# Patient Record
Sex: Female | Born: 1985 | Race: Black or African American | Hispanic: No | Marital: Married | Smoking: Never smoker
Health system: Southern US, Community
[De-identification: ages and names within clinical notes are randomized; demographics above are authoritative.]

## PROBLEM LIST (undated history)

## (undated) DIAGNOSIS — I10 Essential (primary) hypertension: Secondary | ICD-10-CM

## (undated) DIAGNOSIS — T7840XA Allergy, unspecified, initial encounter: Secondary | ICD-10-CM

## (undated) DIAGNOSIS — F419 Anxiety disorder, unspecified: Secondary | ICD-10-CM

## (undated) HISTORY — DX: Anxiety disorder, unspecified: F41.9

## (undated) HISTORY — DX: Allergy, unspecified, initial encounter: T78.40XA

## (undated) HISTORY — DX: Essential (primary) hypertension: I10

---

## 2011-11-27 DIAGNOSIS — Z87898 Personal history of other specified conditions: Secondary | ICD-10-CM | POA: Insufficient documentation

## 2011-11-27 DIAGNOSIS — J310 Chronic rhinitis: Secondary | ICD-10-CM | POA: Insufficient documentation

## 2012-05-06 DIAGNOSIS — Z3400 Encounter for supervision of normal first pregnancy, unspecified trimester: Secondary | ICD-10-CM | POA: Insufficient documentation

## 2013-01-27 ENCOUNTER — Encounter (HOSPITAL_COMMUNITY): Payer: Self-pay | Admitting: Emergency Medicine

## 2013-01-27 ENCOUNTER — Emergency Department (HOSPITAL_COMMUNITY)
Admission: EM | Admit: 2013-01-27 | Discharge: 2013-01-27 | Disposition: A | Discharge status: Home / Self Care | Payer: Medicaid Other | Source: Home / Self Care

## 2013-01-27 DIAGNOSIS — B372 Candidiasis of skin and nail: Secondary | ICD-10-CM

## 2013-01-27 MED ORDER — MUPIROCIN CALCIUM 2 % EX CREA: TOPICAL_CREAM | Freq: Three times a day (TID) | CUTANEOUS | Status: DC

## 2013-01-27 NOTE — ED Provider Notes (Signed)
History     CSN: [id number]  Arrival date & time 01/27/13  1221   None     Chief Complaint  Patient presents with  . Insect Bite    (Consider location/radiation/quality/duration/timing/severity/associated sxs/prior treatment) HPI Comments: And patient is complaining of itching and drainage from the mellitus. There is mild irritation and associated with scant bleeding and. She denies trauma. Denies abdominal pain or GI symptoms.   History reviewed. No pertinent past medical history.  History reviewed. No pertinent past surgical history.  No family history on file.  History  Substance Use Topics  . Smoking status: Never Smoker   . Smokeless tobacco: Not on file  . Alcohol Use: No    OB History   Grav Para Term Preterm Abortions TAB SAB Ect Mult Living                  Review of Systems  All other systems reviewed and are negative.    Allergies  Review of patient's allergies indicates no known allergies.  Home Medications   Current Outpatient Rx  Name  Route  Sig  Dispense  Refill  . Fexofenadine HCl (ALLEGRA PO)   Oral   Take by mouth.         . norethindrone-ethinyl estradiol (JUNEL FE,GILDESS FE,LOESTRIN FE) 1-20 MG-MCG tablet   Oral   Take 1 tablet by mouth daily.         . mupirocin cream (BACTROBAN) 2 %   Topical   Apply topically 3 (three) times daily. To affected area   15 g   0     BP 124/86  Pulse 99  Temp(Src) 98.3 F (36.8 C) (Oral)  Resp 16  SpO2 100%  LMP 01/13/2013  Physical Exam  Nursing note and vitals reviewed. Constitutional: She appears well-developed and well-nourished. No distress.  Abdominal: Soft. Bowel sounds are normal. She exhibits no distension. There is no tenderness. There is no rebound.    ED Course  Procedures (including critical care time)  Labs Reviewed - No data to display No results found.   1. Candida infection of flexural skin       MDM  Apply mupirocin 3 times a day for 5-6 days. Apply  the Lamisil OTC cream or Lotrimin AF to 3 times a day for 2 weeks. Keep the umbilicus dry.        Hayden Rasmussen, NP 01/27/13 [phone]

## 2013-01-27 NOTE — ED Provider Notes (Signed)
Medical screening examination/treatment/procedure(s) were performed by non-physician practitioner and as supervising physician I was immediately available for consultation/collaboration.  Leslee Home, M.D.   Reuben Likes, MD 01/27/13 [phone]

## 2013-01-27 NOTE — ED Notes (Signed)
Pt is here b/c she noticed her belly button yest draining blood and puss... Reports she went to a ladies retreat last week and was exposed to insects... sxs include: itchiness and swelling... Swelling has gone down... Denies: fevers, pain... She is alert and oriented w/no signs of acute distress.

## 2013-07-01 ENCOUNTER — Ambulatory Visit: Payer: Self-pay | Admitting: Advanced Practice Midwife

## 2016-01-08 DIAGNOSIS — H52223 Regular astigmatism, bilateral: Secondary | ICD-10-CM | POA: Diagnosis not present

## 2016-10-16 ENCOUNTER — Ambulatory Visit (INDEPENDENT_AMBULATORY_CARE_PROVIDER_SITE_OTHER): Payer: BLUE CROSS/BLUE SHIELD | Admitting: Family Medicine

## 2016-10-16 ENCOUNTER — Encounter: Payer: Self-pay | Admitting: Family Medicine

## 2016-10-16 VITALS — BP 110/70 | HR 91 | Resp 12 | Ht 66.0 in | Wt 149.1 lb

## 2016-10-16 DIAGNOSIS — Z131 Encounter for screening for diabetes mellitus: Secondary | ICD-10-CM

## 2016-10-16 DIAGNOSIS — J309 Allergic rhinitis, unspecified: Secondary | ICD-10-CM | POA: Diagnosis not present

## 2016-10-16 DIAGNOSIS — Z0001 Encounter for general adult medical examination with abnormal findings: Secondary | ICD-10-CM

## 2016-10-16 DIAGNOSIS — Z30011 Encounter for initial prescription of contraceptive pills: Secondary | ICD-10-CM | POA: Diagnosis not present

## 2016-10-16 LAB — POCT GLYCOSYLATED HEMOGLOBIN (HGB A1C): HEMOGLOBIN A1C: 5.1

## 2016-10-16 MED ORDER — MONTELUKAST SODIUM 10 MG PO TABS: 10.0000 mg | ORAL_TABLET | Freq: Every day | ORAL | 3 refills | Status: DC

## 2016-10-16 MED ORDER — NORETHIN ACE-ETH ESTRAD-FE 1-20 MG-MCG PO TABS: 1.0000 | ORAL_TABLET | Freq: Every day | ORAL | 1 refills | Status: DC

## 2016-10-16 NOTE — Patient Instructions (Signed)
A few things to remember from today's visit:   Encounter for general adult medical examination with abnormal findings  Diabetes mellitus screening - Plan: POCT HgB A1C  Allergic rhinitis, unspecified chronicity, unspecified seasonality, unspecified trigger - Plan: montelukast (SINGULAIR) 10 MG tablet  Encounter for initial prescription of contraceptive pills - Plan: norethindrone-ethinyl estradiol (JUNEL FE,GILDESS FE,LOESTRIN FE) 1-20 MG-MCG tablet    At least 150 minutes of moderate exercise per week, daily brisk walking for 15-30 min is a good exercise option. Healthy diet low in saturated (animal) fats and sweets and consisting of fresh fruits and vegetables, lean meats such as fish and white chicken and whole grains.   - Vaccines:  Tdap vaccine every 10 years.  Shingles vaccine recommended at age 31, could be given after 31 years of age but not sure about insurance coverage.  Pneumonia vaccines:  Prevnar 13 at 65 and Pneumovax at 66.  Screening recommendations for low/normal risk women:  Screening for diabetes at age 31-45 and every 3 years.  Cervical cancer prevention:  -HPV vaccination between 839-31 years old. -Pap smear starts at 31 years of age and continues periodically until 31 years old in low risk women. Pap smear every 3 years between 3121 and 31 years old. Pap smear every 3 years between women 30 and older if pap smear negative and HPV screening negative. OVERDUE,please establish with gyn as discussed.   -Breast cancer: Mammogram: There is disagreement between experts about when to start screening in low risk asymptomatic female but recent recommendations are to start screening at 5040 and not later than 31 years old , every 1-2 years and after 31 yo q 2 years. Screening is recommended until 31 years old but some women can continue screening depending of healthy issues.   Colon cancer screening: starts at 31 years old until 31 years old.  Cholesterol disorder  screening at age 31 and every 3 years.  FOLIC ACID 1 MG DAILY.  A1C: negative for diabetes.  Please be sure medication list is accurate. If a new problem present, please set up appointment sooner than planned today.

## 2016-10-16 NOTE — Progress Notes (Signed)
HPI:   [name] is a 31 y.o. female, who is here today with her husband to establish care and for her routine physical.  Regular exercise 3 or more time per week: No Following a healthy diet: Yes. She lives with husband and their 103 yo daughter.  Chronic medical problems: Allergic rhinitis.She has not started OTC intranasal steroid or antihistaminic,symptoms started today.She wonders if she needs to see immunologists.  She states that her allergies are usually "bad",denies Hx of asthma.   Pap smear 12/2014. She is planning on establishing with gyn in the next few weeks. Hx of abnormal pap smears: Denies. Tdap: She thinks last one was 4 years ago. Hx of STD's 2013, Chlamydia.  -She needs refills on her OCP's, which she has taken for 4 years, ran out a week ago. LMP 10/05/16. She is planning on another child in the next year or so, she is concerned about being on OCP's for "so long", afraid of increasing her risk of cancer.   Review of Systems  Constitutional: Negative for appetite change, fatigue, fever and unexpected weight change.  HENT: Positive for congestion, postnasal drip and rhinorrhea. Negative for dental problem, hearing loss, nosebleeds, sinus pain, sore throat, trouble swallowing and voice change.   Eyes: Negative for pain, redness and visual disturbance.  Respiratory: Negative for cough, shortness of breath and wheezing.   Cardiovascular: Negative for chest pain and leg swelling.  Gastrointestinal: Negative for abdominal pain, blood in stool, nausea and vomiting.       No changes in bowel habits.  Endocrine: Negative for cold intolerance, heat intolerance, polydipsia, polyphagia and polyuria.  Genitourinary: Negative for decreased urine volume, dysuria, hematuria, pelvic pain, vaginal bleeding and vaginal discharge.       No breast tenderness or nipple discharge.  Musculoskeletal: Negative for gait problem and myalgias.  Skin: Negative for rash.    Allergic/Immunologic: Positive for environmental allergies.  Neurological: Negative for syncope, weakness and headaches.  Hematological: Negative for adenopathy. Does not bruise/bleed easily.  Psychiatric/Behavioral: Negative for confusion and sleep disturbance. The patient is nervous/anxious.   All other systems reviewed and are negative.   No current outpatient prescriptions on file prior to visit.   No current facility-administered medications on file prior to visit.      Past Medical History:  Diagnosis Date  . Allergy     No Known Allergies  Family History  Problem Relation Age of Onset  . Stroke Mother     TIA  . Hypertension Mother   . Diabetes Father   . Hypertension Father   . Cancer Maternal Grandmother     ovarian  . Hypertension Maternal Grandmother   . Hypertension Paternal Grandmother   . Hypertension Paternal Uncle   . Diabetes Maternal Grandfather   . Hypertension Maternal Grandfather   . Hypertension Paternal Grandfather     Social History   Social History  . Marital status: Single    Spouse name: N/A  . Number of children: N/A  . Years of education: N/A   Social History Main Topics  . Smoking status: Never Smoker  . Smokeless tobacco: Never Used  . Alcohol use Yes     Comment: occasional  . Drug use: No  . Sexual activity: Yes    Birth control/ protection: Pill   Other Topics Concern  . None   Social History Narrative  . None     Vitals:   10/16/16 1150  BP: 110/70  Pulse:  91  Resp: 12   Body mass index is 24.07 kg/m.  O2 sat 99% at RA.  Wt Readings from Last 3 Encounters:  10/16/16 149 lb 2 oz (67.6 kg)    Physical Exam  Nursing note and vitals reviewed. Constitutional: She is oriented to person, place, and time. She appears well-developed and well-nourished. No distress.  HENT:  Head: Atraumatic.  Right Ear: Hearing and external ear normal.  Left Ear: Hearing and external ear normal.  Nose: Rhinorrhea present.  Right sinus exhibits no maxillary sinus tenderness and no frontal sinus tenderness. Left sinus exhibits no maxillary sinus tenderness and no frontal sinus tenderness.  Mouth/Throat: Uvula is midline, oropharynx is clear and moist and mucous membranes are normal.  Excess cerumen bilateral   Eyes: Conjunctivae and EOM are normal. Pupils are equal, round, and reactive to light.  Neck: Normal range of motion. No thyroid mass and no thyromegaly present.  Cardiovascular: Normal rate and regular rhythm.   No murmur heard. Pulses:      Dorsalis pedis pulses are 2+ on the right side, and 2+ on the left side.  Respiratory: Effort normal and breath sounds normal. No respiratory distress.  GI: Soft. She exhibits no mass. There is no hepatomegaly. There is no tenderness.  Genitourinary: No breast tenderness.  Genitourinary Comments: Breast: Fibrocystic changes outer upper quadrant bilateral. No masses and no nipple discharge. Pelvic exam and pap smear deferred to gyn visit.  Musculoskeletal: She exhibits no edema or tenderness.  No major deformity or signs of synovitis appreciated.  Lymphadenopathy:    She has no cervical adenopathy.    She has no axillary adenopathy.       Right: No supraclavicular adenopathy present.       Left: No supraclavicular adenopathy present.  Neurological: She is alert and oriented to person, place, and time. She has normal strength. No cranial nerve deficit. Coordination and gait normal.  Reflex Scores:      Bicep reflexes are 2+ on the right side and 2+ on the left side.      Patellar reflexes are 2+ on the right side and 2+ on the left side. Skin: Skin is warm. No rash noted. No erythema.  Psychiatric: She has a normal mood and affect. Her speech is normal.  Well groomed, good eye contact.    ASSESSMENT AND PLAN:   [name] was seen today for annual exam.  Diagnoses and all orders for this visit:  Encounter for general adult medical examination with abnormal  findings  We discussed the importance of regular physical activity and healthy diet for prevention of chronic illness and/or complications. Preventive guidelines reviewed. Vaccination up to date per pt report. Daily Folic acid 1 mg recommended. She will establish with gyn for her Pap smear.  Next CPE in 1-3 years.  Diabetes mellitus screening -     POCT HgB A1C: 5.1  Allergic rhinitis, unspecified chronicity, unspecified seasonality, unspecified trigger  Symptomatic. Zyrtec 10 mg OTC,continue Flonase nasal spray. Singulair 10 mg added. She was instructed to let me know in 4-6 weeks if medications have helped or if she still would like immunology referral.   -     montelukast (SINGULAIR) 10 MG tablet; Take 1 tablet (10 mg total) by mouth at bedtime.  Encounter for initial prescription of contraceptive pills  Resume OCP's with her next menstrual period. Condoms use for now. Some side effects discussed as well as current evidence about gyn cancer and OCP's based on observational studies.  -  norethindrone-ethinyl estradiol (JUNEL FE,GILDESS FE,LOESTRIN FE) 1-20 MG-MCG tablet; Take 1 tablet by mouth daily.     Return in 1 year (on 10/16/2017).    Porfirio Bollier G. [place], MD  [facility]. [place] office.

## 2016-10-16 NOTE — Progress Notes (Signed)
Pre visit review using our clinic review tool, if applicable. No additional management support is needed unless otherwise documented below in the visit note. 

## 2016-11-06 DIAGNOSIS — H16101 Unspecified superficial keratitis, right eye: Secondary | ICD-10-CM | POA: Diagnosis not present

## 2016-12-01 ENCOUNTER — Telehealth: Payer: Self-pay | Admitting: Family Medicine

## 2016-12-01 NOTE — Telephone Encounter (Signed)
Pt would like to know if she can transfer from [place] to [place] .  Mother in law see Plot

## 2016-12-02 NOTE — Telephone Encounter (Signed)
OK w/me Thx 

## 2016-12-04 NOTE — Telephone Encounter (Signed)
It is Ok with me. Thanks

## 2017-04-03 ENCOUNTER — Other Ambulatory Visit: Payer: Self-pay | Admitting: Family Medicine

## 2017-04-03 DIAGNOSIS — Z30011 Encounter for initial prescription of contraceptive pills: Secondary | ICD-10-CM

## 2017-05-29 DIAGNOSIS — F4323 Adjustment disorder with mixed anxiety and depressed mood: Secondary | ICD-10-CM | POA: Diagnosis not present

## 2017-06-15 DIAGNOSIS — F4323 Adjustment disorder with mixed anxiety and depressed mood: Secondary | ICD-10-CM | POA: Diagnosis not present

## 2017-07-17 DIAGNOSIS — F4323 Adjustment disorder with mixed anxiety and depressed mood: Secondary | ICD-10-CM | POA: Diagnosis not present

## 2017-08-16 DIAGNOSIS — J020 Streptococcal pharyngitis: Secondary | ICD-10-CM | POA: Diagnosis not present

## 2017-08-16 DIAGNOSIS — J029 Acute pharyngitis, unspecified: Secondary | ICD-10-CM | POA: Diagnosis not present

## 2017-08-27 DIAGNOSIS — J069 Acute upper respiratory infection, unspecified: Secondary | ICD-10-CM | POA: Diagnosis not present

## 2017-11-06 DIAGNOSIS — F4323 Adjustment disorder with mixed anxiety and depressed mood: Secondary | ICD-10-CM | POA: Diagnosis not present

## 2017-11-15 DIAGNOSIS — F4323 Adjustment disorder with mixed anxiety and depressed mood: Secondary | ICD-10-CM | POA: Diagnosis not present

## 2017-12-11 DIAGNOSIS — F4323 Adjustment disorder with mixed anxiety and depressed mood: Secondary | ICD-10-CM | POA: Diagnosis not present

## 2018-01-10 ENCOUNTER — Ambulatory Visit (INDEPENDENT_AMBULATORY_CARE_PROVIDER_SITE_OTHER): Payer: BLUE CROSS/BLUE SHIELD | Admitting: Family Medicine

## 2018-01-10 ENCOUNTER — Encounter: Payer: Self-pay | Admitting: Family Medicine

## 2018-01-10 VITALS — BP 120/84 | HR 87 | Temp 98.3°F | Resp 12 | Ht 66.25 in | Wt 150.6 lb

## 2018-01-10 DIAGNOSIS — Z0000 Encounter for general adult medical examination without abnormal findings: Secondary | ICD-10-CM | POA: Diagnosis not present

## 2018-01-10 NOTE — Patient Instructions (Addendum)
A few things to remember from today's visit:   Routine general medical examination at a health care facility  Today you have you routine preventive visit.  At least 150 minutes of moderate exercise per week, daily brisk walking for 15-30 min is a good exercise option. Healthy diet low in saturated (animal) fats and sweets and consisting of fresh fruits and vegetables, lean meats such as fish and white chicken and whole grains.  These are some of recommendations for screening depending of age and risk factors:  Folic acid 1 mg daily.   - Vaccines:  Tdap vaccine every 10 years.  Shingles vaccine recommended at age 24, could be given after 32 years of age but not sure about insurance coverage.   Pneumonia vaccines:  Prevnar 13 at 65 and Pneumovax at 66. Sometimes Pneumovax is giving earlier if history of smoking, lung disease,diabetes,kidney disease among some.    Screening for diabetes at age 84 and every 3 years.  Cervical cancer prevention:  Pap smear starts at 31 years of age and continues periodically until 32 years old in low risk women. Pap smear every 3 years between 2 and 5 years old. Pap smear every 3-5 years between women 30 and older if pap smear negative and HPV screening negative.   -Breast cancer: Mammogram: There is disagreement between experts about when to start screening in low risk asymptomatic female but recent recommendations are to start screening at 67 and not later than 32 years old , every 1-2 years and after 32 yo q 2 years. Screening is recommended until 32 years old but some women can continue screening depending of healthy issues.   Colon cancer screening: starts at 32 years old until 32 years old.  Cholesterol disorder screening at age 46 and every 3 years.  Also recommended:  1. Dental visit- Brush and floss your teeth twice daily; visit your dentist twice a year. 2. Eye doctor- Get an eye exam at least every 2 years. 3. Helmet use- Always  wear a helmet when riding a bicycle, motorcycle, rollerblading or skateboarding. 4. Safe sex- If you may be exposed to sexually transmitted infections, use a condom. 5. Seat belts- Seat belts can save your live; always wear one. 6. Smoke/Carbon Monoxide detectors- These detectors need to be installed on the appropriate level of your home. Replace batteries at least once a year. 7. Skin cancer- When out in the sun please cover up and use sunscreen 15 SPF or higher. 8. Violence- If anyone is threatening or hurting you, please tell your healthcare provider.  9. Drink alcohol in moderation- Limit alcohol intake to one drink or less per day. Never drink and drive.   Please be sure medication list is accurate. If a new problem present, please set up appointment sooner than planned today.

## 2018-01-10 NOTE — Progress Notes (Signed)
HPI:   [name] is a 32 y.o. female, who is here today for her routine physical.  Last CPE: 10/2016  Regular exercise 3 or more time per week: Not consistently. Following a healthy diet: Yes. She lives with her husband and daughter.  Chronic medical problems: Allergic rhinitis.  She is continue Singulair, she prefers to take something "natural."   Pap smear, 2015.  Last visit she was planning on establishing with gynecologist, she has not done so. She is still taking OCPs.  Hx of abnormal pap smears: Over 10 years ago. Hx of STD's: Chlamydia in 2013.   There is no immunization history on file for this patient.   She has no concerns today.     Review of Systems  Constitutional: Negative for appetite change, fatigue and fever.  HENT: Positive for congestion, postnasal drip and rhinorrhea. Negative for hearing loss, mouth sores, trouble swallowing and voice change.   Eyes: Negative for photophobia and visual disturbance.  Respiratory: Negative for cough, shortness of breath and wheezing.   Cardiovascular: Negative for chest pain and leg swelling.  Gastrointestinal: Negative for abdominal pain, nausea and vomiting.       No changes in bowel habits.  Endocrine: Negative for cold intolerance, heat intolerance, polydipsia, polyphagia and polyuria.  Genitourinary: Negative for decreased urine volume, dysuria, hematuria, vaginal bleeding and vaginal discharge.  Musculoskeletal: Negative for arthralgias, back pain and neck pain.  Skin: Negative for color change and rash.  Allergic/Immunologic: Positive for environmental allergies.  Neurological: Negative for seizures, syncope, weakness, numbness and headaches.  Hematological: Negative for adenopathy. Does not bruise/bleed easily.  Psychiatric/Behavioral: Negative for confusion and sleep disturbance. The patient is not nervous/anxious.   All other systems reviewed and are negative.     Current Outpatient  Medications on File Prior to Visit  Medication Sig Dispense Refill  . JUNEL FE 1/20 1-20 MG-MCG tablet TAKE 1 TABLET BY MOUTH ONCE DAILY 84 tablet 3   No current facility-administered medications on file prior to visit.      Past Medical History:  Diagnosis Date  . Allergy     History reviewed. No pertinent surgical history.  No Known Allergies  Family History  Problem Relation Age of Onset  . Stroke Mother        TIA  . Hypertension Mother   . Diabetes Father   . Hypertension Father   . Cancer Maternal Grandmother        ovarian  . Hypertension Maternal Grandmother   . Hypertension Paternal Grandmother   . Hypertension Paternal Uncle   . Diabetes Maternal Grandfather   . Hypertension Maternal Grandfather   . Hypertension Paternal Grandfather     Social History   Socioeconomic History  . Marital status: Married    Spouse name: Not on file  . Number of children: Not on file  . Years of education: Not on file  . Highest education level: Not on file  Occupational History  . Not on file  Social Needs  . Financial resource strain: Not on file  . Food insecurity:    Worry: Not on file    Inability: Not on file  . Transportation needs:    Medical: Not on file    Non-medical: Not on file  Tobacco Use  . Smoking status: Never Smoker  . Smokeless tobacco: Never Used  Substance and Sexual Activity  . Alcohol use: Yes    Comment: occasional  . Drug use: No  .  Sexual activity: Yes    Birth control/protection: Pill  Lifestyle  . Physical activity:    Days per week: Not on file    Minutes per session: Not on file  . Stress: Not on file  Relationships  . Social connections:    Talks on phone: Not on file    Gets together: Not on file    Attends religious service: Not on file    Active member of club or organization: Not on file    Attends meetings of clubs or organizations: Not on file    Relationship status: Not on file  Other Topics Concern  . Not on file    Social History Narrative  . Not on file     Vitals:   01/10/18 0913  BP: 120/84  Pulse: 87  Resp: 12  Temp: 98.3 F (36.8 C)  SpO2: 93%   Body mass index is 24.12 kg/m.   Wt Readings from Last 3 Encounters:  01/10/18 150 lb 9.6 oz (68.3 kg)  10/16/16 149 lb 2 oz (67.6 kg)    Physical Exam  Nursing note and vitals reviewed. Constitutional: She is oriented to person, place, and time. She appears well-developed and well-nourished. No distress.  HENT:  Head: Normocephalic and atraumatic.  Right Ear: Hearing, tympanic membrane, external ear and ear canal normal.  Left Ear: Hearing, tympanic membrane, external ear and ear canal normal.  Mouth/Throat: Uvula is midline, oropharynx is clear and moist and mucous membranes are normal.  Eyes: Pupils are equal, round, and reactive to light. Conjunctivae and EOM are normal.  Neck: No tracheal deviation present. No thyromegaly present.  Cardiovascular: Normal rate and regular rhythm.  No murmur heard. Pulses:      Dorsalis pedis pulses are 2+ on the right side, and 2+ on the left side.  Respiratory: Effort normal and breath sounds normal. No respiratory distress.  GI: Soft. She exhibits no mass. There is no hepatomegaly. There is no tenderness.  Genitourinary:  Genitourinary Comments: Breast: Fibrocystic changes L>R,outer upper quadrant. No masses,skin abnormalities,or nipple discharge.  Musculoskeletal: She exhibits no edema.  No major deformity or signs of synovitis appreciated.  Lymphadenopathy:    She has no cervical adenopathy.       Right: No supraclavicular adenopathy present.       Left: No supraclavicular adenopathy present.  Neurological: She is alert and oriented to person, place, and time. She has normal strength. No cranial nerve deficit. Coordination and gait normal.  Reflex Scores:      Bicep reflexes are 2+ on the right side and 2+ on the left side.      Patellar reflexes are 2+ on the right side and 2+ on the  left side. Skin: Skin is warm. No rash noted. No erythema.  Psychiatric: She has a normal mood and affect. Cognition and memory are normal.  Well groomed, good eye contact.      ASSESSMENT AND PLAN:  Ms. Tammie Hernandez was here today annual physical examination.   Routine general medical examination at a health care facility  We discussed the importance of regular physical activity and healthy diet for prevention of chronic illness and/or complications. Preventive guidelines reviewed. Vaccination up to date per pt report.She is not sure about date of her last Tdap but sure it has not been 10 years. She did not want pap smear today,she is overdue. Planning on pregnancy ,so she would like to wait until she establishes with gyn.Ensures me she will arrange appt with  gyn. Daily folic acid 1 mg. Next CPE in 1-2 year.     Return in 1 year (on 01/11/2019).        Keny Donald G. [place], MD  [facility]. [place] office.

## 2018-02-08 ENCOUNTER — Ambulatory Visit: Payer: Self-pay | Admitting: *Deleted

## 2018-02-08 ENCOUNTER — Ambulatory Visit (INDEPENDENT_AMBULATORY_CARE_PROVIDER_SITE_OTHER): Payer: PRIVATE HEALTH INSURANCE | Admitting: Family Medicine

## 2018-02-08 ENCOUNTER — Encounter: Payer: Self-pay | Admitting: Family Medicine

## 2018-02-08 VITALS — BP 118/72 | HR 72 | Temp 98.5°F | Resp 12 | Ht 66.25 in | Wt 147.0 lb

## 2018-02-08 DIAGNOSIS — R1013 Epigastric pain: Secondary | ICD-10-CM | POA: Diagnosis not present

## 2018-02-08 DIAGNOSIS — M545 Low back pain, unspecified: Secondary | ICD-10-CM

## 2018-02-08 LAB — POCT URINALYSIS DIPSTICK
Bilirubin, UA: NEGATIVE
Glucose, UA: NEGATIVE
KETONES UA: NEGATIVE
Leukocytes, UA: NEGATIVE
Nitrite, UA: NEGATIVE
PH UA: 6.5 (ref 5.0–8.0)
Protein, UA: POSITIVE — AB
RBC UA: POSITIVE
Spec Grav, UA: 1.025 (ref 1.010–1.025)
UROBILINOGEN UA: 0.2 U/dL

## 2018-02-08 MED ORDER — MELOXICAM 7.5 MG PO TABS: 7.5000 mg | ORAL_TABLET | Freq: Two times a day (BID) | ORAL | 0 refills | Status: AC | PRN

## 2018-02-08 MED ORDER — OMEPRAZOLE 40 MG PO CPDR: 40.0000 mg | DELAYED_RELEASE_CAPSULE | Freq: Every day | ORAL | 0 refills | Status: DC

## 2018-02-08 NOTE — Telephone Encounter (Signed)
Pt called with pain at her lower back down to her tailbone. She states the pain sometimes travel down towards her left thigh. Always worst after sitting all day. Has used heat and taken ibuprofen but without much relief.  Also she is having some upper abd discomfort, possibly IBS. Had bm this morning that was normal. No nausea or vomiting or cramping. [facility].  Pt advised that the office will give her a call back regarding an appointment. Pt voiced understanding. Home care given with verbal understanding.  Reason for Disposition . [1] Pain radiates into the thigh or further down the leg AND [2] both legs  Answer Assessment - Initial Assessment Questions 1. ONSET: "When did the pain begin?"      First weekend in June 2. LOCATION: "Where does it hurt?" (upper, mid or lower back)     Lower part of back close to tailbone 3. SEVERITY: "How bad is the pain?"  (e.g., Scale 1-10; mild, moderate, or severe)   - MILD (1-3): doesn't interfere with normal activities    - MODERATE (4-7): interferes with normal activities or awakens from sleep    - SEVERE (8-10): excruciating pain, unable to do any normal activities      Pain # 7 or 8 near the end of the day 4. PATTERN: "Is the pain constant?" (e.g., yes, no; constant, intermittent)      constant 5. RADIATION: "Does the pain shoot into your legs or elsewhere?"     Towards the back of left leg to the thigh 6. CAUSE:  "What do you think is causing the back pain?"      No sure 7. BACK OVERUSE:  "Any recent lifting of heavy objects, strenuous work or exercise?"     no 8. MEDICATIONS: "What have you taken so far for the pain?" (e.g., nothing, acetaminophen, NSAIDS)     Ibuprofen, heating pad 9. NEUROLOGIC SYMPTOMS: "Do you have any weakness, numbness, or problems with bowel/bladder control?"     no 10. OTHER SYMPTOMS: "Do you have any other symptoms?" (e.g., fever, abdominal pain, burning with  urination, blood in urine)       abd discomfort 11. PREGNANCY: "Is there any chance you are pregnant?" (e.g., yes, no; LMP)       No. LMP this week  Answer Assessment - Initial Assessment Questions 1. LOCATION: "Where does it hurt?"      Upper abd 2. RADIATION: "Does the pain shoot anywhere else?" (e.g., chest, back)     no 3. ONSET: "When did the pain begin?" (e.g., minutes, hours or days ago)      Over last weekend 4. SUDDEN: "Gradual or sudden onset?"     gradual 5. PATTERN "Does the pain come and go, or is it constant?"    - If constant: "Is it getting better, staying the same, or worsening?"      (Note: Constant means the pain never goes away completely; most serious pain is constant and it progresses)     - If intermittent: "How long does it last?" "Do you have pain now?"     (Note: Intermittent means the pain goes away completely between bouts)     constant 6. SEVERITY: "How bad is the pain?"  (e.g., Scale 1-10; mild, moderate, or severe)   - MILD (1-3): doesn't interfere with normal activities, abdomen soft and not tender to touch    - MODERATE (4-7): interferes with normal activities or awakens from  sleep, tender to touch    - SEVERE (8-10): excruciating pain, doubled over, unable to do any normal activities      discomfort 7. RECURRENT SYMPTOM: "Have you ever had this type of abdominal pain before?" If so, ask: "When was the last time?" and "What happened that time?"      no 8. CAUSE: "What do you think is causing the abdominal pain?"     Not sure, maybe IBS 9. RELIEVING/AGGRAVATING FACTORS: "What makes it better or worse?" (e.g., movement, antacids, bowel movement)     n/a 10. OTHER SYMPTOMS: "Has there been any vomiting, diarrhea, constipation, or urine problems?"       Earlier this week, had diarrhea  Protocols used: BACK PAIN-A-AH, ABDOMINAL PAIN - [facility]

## 2018-02-08 NOTE — Telephone Encounter (Signed)
Please ask her if she can come today around  2:30 pm, she may need to wait. Otherwise [facility].  Thanks, BJ

## 2018-02-08 NOTE — Progress Notes (Signed)
ACUTE VISIT   HPI:  Chief Complaint  Patient presents with  . Back Pain    started 1 month ago, flared up really bad over the last few days    Tammie Hernandez is a 32 y.o. female, who is here today complaining of a days of lower back pain, no radiated. She has had this pain intermittently for a while, aggravated sometimes by heavy lifting.   Sore leg pain, 8/10 max, it has been stable.  It is exacerbated by standing up on with movement. It is alleviated when she sits in certain way. She has not noted local edema, erythema, or rash. She denies lower extremity numbness or tingling, saddle anesthesia, or urine/bowel incontinence. In the past she has taking Motrin and have used heating pad, she has not tried any treatment recently.  No associated urinary symptoms.  She states that she does not want to take anti-inflammatory medication without knowing exactly what is causing the pain. She denies any history of trauma. No associated arthralgia or joint erythema. Denies dysuria,increased urinary frequency, gross hematuria,or decreased urine output.   Abdominal pain: She is also complaining about a week of epigastric discomfort, bloating sensation.  She has not noted heartburn or burping. She has not identified exacerbating or alleviating factors. Associated nausea. No fever, chills, changes in bowel habits, melena, vomiting, or abnormal weight loss.  She has not try OTC medication.  Last bowel movement yesterday. LMP 02/04/18.   Review of Systems  Constitutional: Negative for activity change, appetite change, fatigue, fever and unexpected weight change.  HENT: Negative for mouth sores, sore throat and trouble swallowing.   Respiratory: Negative for cough, shortness of breath and wheezing.   Cardiovascular: Negative for leg swelling.  Gastrointestinal: Positive for abdominal pain and nausea. Negative for blood in stool and vomiting.       Negative for changes  in bowel habits.  Genitourinary: Negative for decreased urine volume, dysuria, hematuria, vaginal bleeding and vaginal discharge.  Musculoskeletal: Positive for back pain. Negative for arthralgias, joint swelling and neck pain.  Skin: Negative for rash and wound.  Allergic/Immunologic: Positive for environmental allergies.  Neurological: Negative for weakness, numbness and headaches.  Hematological: Negative for adenopathy. Does not bruise/bleed easily.  Psychiatric/Behavioral: Negative for confusion. The patient is nervous/anxious.       Current Outpatient Medications on File Prior to Visit  Medication Sig Dispense Refill  . JUNEL FE 1/20 1-20 MG-MCG tablet TAKE 1 TABLET BY MOUTH ONCE DAILY 84 tablet 3   No current facility-administered medications on file prior to visit.      Past Medical History:  Diagnosis Date  . Allergy    No Known Allergies  Social History   Socioeconomic History  . Marital status: Married    Spouse name: Not on file  . Number of children: Not on file  . Years of education: Not on file  . Highest education level: Not on file  Occupational History  . Not on file  Social Needs  . Financial resource strain: Not on file  . Food insecurity:    Worry: Not on file    Inability: Not on file  . Transportation needs:    Medical: Not on file    Non-medical: Not on file  Tobacco Use  . Smoking status: Never Smoker  . Smokeless tobacco: Never Used  Substance and Sexual Activity  . Alcohol use: Yes    Comment: occasional  . Drug use: No  . Sexual  activity: Yes    Birth control/protection: Pill  Lifestyle  . Physical activity:    Days per week: Not on file    Minutes per session: Not on file  . Stress: Not on file  Relationships  . Social connections:    Talks on phone: Not on file    Gets together: Not on file    Attends religious service: Not on file    Active member of club or organization: Not on file    Attends meetings of clubs or  organizations: Not on file    Relationship status: Not on file  Other Topics Concern  . Not on file  Social History Narrative  . Not on file    Vitals:   02/08/18 1531  BP: 118/72  Pulse: 72  Resp: 12  Temp: 98.5 F (36.9 C)  SpO2: 99%   Body mass index is 23.55 kg/m.   Physical Exam  Nursing note and vitals reviewed. Constitutional: She is oriented to person, place, and time. She appears well-developed and well-nourished. She does not appear ill. No distress.  HENT:  Head: Normocephalic and atraumatic.  Eyes: Conjunctivae are normal.  Cardiovascular: Normal rate and regular rhythm.  Respiratory: Effort normal and breath sounds normal. No respiratory distress.  GI: Soft. She exhibits no mass. There is no hepatomegaly. There is tenderness in the epigastric area. There is no rebound and no guarding.  Musculoskeletal: She exhibits no edema.       Lumbar back: She exhibits decreased range of motion. She exhibits no tenderness, no bony tenderness and no edema.  No significant deformity appreciated. No tenderness upon palpation of paraspinal muscles. Pain elicited with movement on exam table during examination. No local edema or erythema appreciated, no suspicious lesions.  Antalgic gait.  Lymphadenopathy:    She has no cervical adenopathy.  Neurological: She is alert and oriented to person, place, and time. She has normal strength.  SLR negative bilateral,elicits lower back pain.  Skin: Skin is warm. No rash noted. No erythema.  Psychiatric: She has a normal mood and affect.  Well groomed, good eye contact.      ASSESSMENT AND PLAN:   Tammie Hernandez was seen today for back pain.  Diagnoses and all orders for this visit:  Bilateral low back pain without sciatica, unspecified chronicity  Most likely musculoskeletal and benign. Because she is concerned about serious process, imaging was ordered. After discussion of some side effects, she agrees with trying meloxicam  7.5 mg twice daily as needed for up to 15 days. OTC IcyHot or Aspercreme with lidocaine may also help.  -     POCT urinalysis dipstick -     DG Sacrum/Coccyx; Future -     meloxicam (MOBIC) 7.5 MG tablet; Take 1 tablet (7.5 mg total) by mouth 2 (two) times daily as needed for up to 15 days for pain.  Epigastric abdominal pain  ?  Dyspepsia. GERD precautions discussed. Recommend omeprazole 40 mg daily for 3 to 4 weeks. Instructed about warning signs. Follow-up as needed.   -     omeprazole (PRILOSEC) 40 MG capsule; Take 1 capsule (40 mg total) by mouth daily.        Return if symptoms worsen or fail to improve.     Betty G. [place], MD  [facility]. [facility].

## 2018-02-08 NOTE — Patient Instructions (Addendum)
A few things to remember from today's visit:   Bilateral low back pain without sciatica, unspecified chronicity - Plan: POCT urinalysis dipstick, DG Sacrum/Coccyx, meloxicam (MOBIC) 7.5 MG tablet  Epigastric abdominal pain  Over-the-counter IcyHot or Aspercreme with lidocaine may help. Pain seems to be musculoskeletal.  GERD:  Avoid foods that make your symptoms worse, for example coffee, chocolate,pepermeint,alcohol, and greasy food. Raising the head of your bed about 6 inches may help with nocturnal symptoms.   Instead 3 large meals daily try small and more frequent meals during the day.   You should be evaluated immediately if bloody vomiting, bloody stools, black stools (like tar), difficulty swallowing, food gets stuck on the way down or choking when eating. Abnormal weight loss or severe abdominal pain.  If symptoms are not resolved sometimes endoscopy is necessary.   Please be sure medication list is accurate. If a new problem present, please set up appointment sooner than planned today.

## 2018-02-08 NOTE — Telephone Encounter (Signed)
Patient returned call stated she would be here around 2:45 or 3 pm. Informed her that it was okay.

## 2018-02-08 NOTE — Telephone Encounter (Signed)
Left detailed message informing patient that she can come in at around 2:30 pm today or tomorrow at the [facility] from 9 to 1.

## 2018-02-08 NOTE — Telephone Encounter (Signed)
Message sent to Dr.Jordan. 

## 2018-02-09 ENCOUNTER — Other Ambulatory Visit: Payer: Self-pay | Admitting: Family Medicine

## 2018-02-09 DIAGNOSIS — Z30011 Encounter for initial prescription of contraceptive pills: Secondary | ICD-10-CM

## 2018-03-03 ENCOUNTER — Other Ambulatory Visit: Payer: Self-pay | Admitting: Family Medicine

## 2018-03-03 DIAGNOSIS — R1013 Epigastric pain: Secondary | ICD-10-CM

## 2018-04-12 ENCOUNTER — Other Ambulatory Visit: Payer: Self-pay | Admitting: Family Medicine

## 2018-04-12 DIAGNOSIS — Z30011 Encounter for initial prescription of contraceptive pills: Secondary | ICD-10-CM

## 2018-07-04 ENCOUNTER — Other Ambulatory Visit: Payer: Self-pay | Admitting: Family Medicine

## 2018-07-04 DIAGNOSIS — Z30011 Encounter for initial prescription of contraceptive pills: Secondary | ICD-10-CM

## 2018-08-21 ENCOUNTER — Encounter: Payer: Self-pay | Admitting: Family Medicine

## 2018-08-21 ENCOUNTER — Ambulatory Visit (INDEPENDENT_AMBULATORY_CARE_PROVIDER_SITE_OTHER): Payer: PRIVATE HEALTH INSURANCE | Admitting: Family Medicine

## 2018-08-21 VITALS — BP 120/68 | HR 85 | Temp 97.8°F | Resp 12 | Ht 66.25 in | Wt 149.4 lb

## 2018-08-21 DIAGNOSIS — R221 Localized swelling, mass and lump, neck: Secondary | ICD-10-CM | POA: Diagnosis not present

## 2018-08-21 DIAGNOSIS — J309 Allergic rhinitis, unspecified: Secondary | ICD-10-CM | POA: Diagnosis not present

## 2018-08-21 DIAGNOSIS — R519 Headache, unspecified: Secondary | ICD-10-CM

## 2018-08-21 DIAGNOSIS — R51 Headache: Secondary | ICD-10-CM

## 2018-08-21 MED ORDER — FLUTICASONE PROPIONATE 50 MCG/ACT NA SUSP: 1.0000 | Freq: Two times a day (BID) | NASAL | 3 refills | Status: DC

## 2018-08-21 NOTE — Patient Instructions (Signed)
A few things to remember from today's visit:   Allergic rhinitis, unspecified seasonality, unspecified trigger - Plan: fluticasone (FLONASE) 50 MCG/ACT nasal spray  Frontal headache  On examination today we could not find lump on your neck. Continue monitoring area.   Flonase intranasal steroid daily and saline nasal irrigations several times per day may help with sinus pressure. Also over-the-counter antihistaminic like Zyrtec 10 mg daily.  Please be sure medication list is accurate. If a new problem present, please set up appointment sooner than planned today.

## 2018-08-21 NOTE — Progress Notes (Signed)
ACUTE VISIT   HPI:  Chief Complaint  Patient presents with  . Lump under right ear    swollen, no pain  . Sinus issues    Ms.Renae Lenetta QuakerDenise Whisonant is a 33 y.o. female, who is here today complaining of above problems. "A while" ago she noted "lump" behind right ear lobe. She has not noted growth,skin changes,or pain. Denies fever,chills,night sweats,or abnormal wt loss. Negative for oral lesions,earache,or drainage.  States that it seems to be gone but still would like this to be evaluated.  Mild frontal pressure headache for the past few days. It is intermittent, worse in the morning. She has not identified exacerbating or alleviating factors.  Nasal congestion,rhinorrhea,and post nasal drainage. No sick contact or recent travel.  + Fatigue. Denies associated visual changes,nausea,vomiting,or focal deficit. Symptoms are stable.  She has not tried OTC treatments.  Allergic rhinitis, she is not on pharmacologic treatment.   Review of Systems  Constitutional: Positive for fatigue. Negative for activity change, appetite change and fever.  HENT: Positive for congestion, postnasal drip, sinus pressure and sneezing. Negative for ear pain, mouth sores, sore throat, trouble swallowing and voice change.   Eyes: Negative for discharge, redness and itching.  Respiratory: Positive for cough. Negative for shortness of breath and wheezing.   Gastrointestinal: Negative for abdominal pain, diarrhea, nausea and vomiting.  Musculoskeletal: Negative for gait problem and neck pain.  Skin: Negative for rash.  Allergic/Immunologic: Positive for environmental allergies.  Neurological: Positive for headaches. Negative for weakness.  Hematological: Positive for adenopathy.      Current Outpatient Medications on File Prior to Visit  Medication Sig Dispense Refill  . JUNEL FE 1/20 1-20 MG-MCG tablet TAKE 1 TABLET BY MOUTH EVERY DAY 84 tablet 0   No current facility-administered  medications on file prior to visit.      Past Medical History:  Diagnosis Date  . Allergy    No Known Allergies  Social History   Socioeconomic History  . Marital status: Married    Spouse name: Not on file  . Number of children: Not on file  . Years of education: Not on file  . Highest education level: Not on file  Occupational History  . Not on file  Social Needs  . Financial resource strain: Not on file  . Food insecurity:    Worry: Not on file    Inability: Not on file  . Transportation needs:    Medical: Not on file    Non-medical: Not on file  Tobacco Use  . Smoking status: Never Smoker  . Smokeless tobacco: Never Used  Substance and Sexual Activity  . Alcohol use: Yes    Comment: occasional  . Drug use: No  . Sexual activity: Yes    Birth control/protection: Pill  Lifestyle  . Physical activity:    Days per week: Not on file    Minutes per session: Not on file  . Stress: Not on file  Relationships  . Social connections:    Talks on phone: Not on file    Gets together: Not on file    Attends religious service: Not on file    Active member of club or organization: Not on file    Attends meetings of clubs or organizations: Not on file    Relationship status: Not on file  Other Topics Concern  . Not on file  Social History Narrative  . Not on file    Vitals:   08/21/18 0936  BP: 120/68  Pulse: 85  Resp: 12  Temp: 97.8 F (36.6 C)  SpO2: 98%   Body mass index is 23.93 kg/m.   Physical Exam  Nursing note and vitals reviewed. Constitutional: She is oriented to person, place, and time. She appears well-developed. She does not appear ill. No distress.  HENT:  Head: Normocephalic and atraumatic.  Right Ear: External ear normal.  Left Ear: External ear normal.  Nose: Right sinus exhibits no maxillary sinus tenderness and no frontal sinus tenderness. Left sinus exhibits no maxillary sinus tenderness and no frontal sinus tenderness.    Mouth/Throat: Oropharynx is clear and moist and mucous membranes are normal.  Excess cerumen bilateral,TM partially seen and no erythema. Hypertrophic turbinates. Postnasal drainage.   Eyes: Pupils are equal, round, and reactive to light. Conjunctivae and EOM are normal.  Neck: No muscular tenderness present. No edema and no erythema present.  Cardiovascular: Normal rate and regular rhythm.  No murmur heard. Respiratory: Effort normal and breath sounds normal. No respiratory distress.  Lymphadenopathy:       Head (right side): No submandibular, no preauricular and no posterior auricular adenopathy present.       Head (left side): No submandibular, no preauricular and no posterior auricular adenopathy present.    She has no cervical adenopathy.  Neurological: She is alert and oriented to person, place, and time. She has normal strength. No cranial nerve deficit. Gait normal.  Skin: Skin is warm. No rash noted. No erythema.  Psychiatric: Her mood appears anxious.  Well groomed, good eye contact.    ASSESSMENT AND PLAN:  Ms. Dariella was seen today for lump under right ear and sinus issues.  Diagnoses and all orders for this visit:  Frontal headache We discussed possible etiologies. Allergies,tension headache among some. Neurologic evaluation today normal,I do not think head imaging is needed at this time. Instructed about warning signs.  Allergic rhinitis, unspecified seasonality, unspecified trigger This problem could be causing some of her symptoms. Intranasal steroid,Flonase, daily for a few weeks then prn. Nasal saline irrigations several times per day and prn. OTC antihistaminic may also help. F/U as needed.  -     fluticasone (FLONASE) 50 MCG/ACT nasal spray; Place 1 spray into both nostrils 2 (two) times daily.  Localized swelling, mass or lump of neck I did not find masses or any abnormality on area of concern. She cannot find "lump"either. We discussed possible  etiologies, Hx does not suggest a serious process, so I do nit think lab work is needed today. Continue monitoring area.       Return if symptoms worsen or fail to improve.     Onyinyechi Huante G. [place], MD  [facility]. [place] office.

## 2018-08-22 ENCOUNTER — Encounter: Payer: Self-pay | Admitting: Family Medicine

## 2018-10-17 DIAGNOSIS — N760 Acute vaginitis: Secondary | ICD-10-CM | POA: Diagnosis not present

## 2019-01-16 DIAGNOSIS — N760 Acute vaginitis: Secondary | ICD-10-CM | POA: Diagnosis not present

## 2019-01-17 DIAGNOSIS — H52223 Regular astigmatism, bilateral: Secondary | ICD-10-CM | POA: Diagnosis not present

## 2019-01-23 ENCOUNTER — Ambulatory Visit: Payer: Self-pay | Admitting: *Deleted

## 2019-01-23 ENCOUNTER — Encounter (HOSPITAL_COMMUNITY): Payer: Self-pay

## 2019-01-23 ENCOUNTER — Other Ambulatory Visit: Payer: Self-pay

## 2019-01-23 ENCOUNTER — Ambulatory Visit: Payer: PRIVATE HEALTH INSURANCE | Admitting: Adult Health

## 2019-01-23 ENCOUNTER — Ambulatory Visit (HOSPITAL_COMMUNITY)
Admission: EM | Admit: 2019-01-23 | Discharge: 2019-01-23 | Disposition: A | Discharge status: Home / Self Care | Payer: BLUE CROSS/BLUE SHIELD | Attending: Family Medicine | Admitting: Family Medicine

## 2019-01-23 DIAGNOSIS — L509 Urticaria, unspecified: Secondary | ICD-10-CM | POA: Diagnosis not present

## 2019-01-23 MED ORDER — DEXAMETHASONE SODIUM PHOSPHATE 10 MG/ML IJ SOLN
10.0000 mg | Freq: Once | INTRAMUSCULAR | Status: AC
  Administered 2019-01-23: 10 mg via INTRAMUSCULAR

## 2019-01-23 MED ORDER — DEXAMETHASONE SODIUM PHOSPHATE 10 MG/ML IJ SOLN
INTRAMUSCULAR | Status: AC
  Filled 2019-01-23: qty 1

## 2019-01-23 NOTE — Telephone Encounter (Signed)
Pt scheduled for virtual visit today.  

## 2019-01-23 NOTE — Telephone Encounter (Signed)
Pt called with hives, light headed.  and nausea which started on 01/22/2019 around 1700; the nausea has subsided; she has taken benadryl ( 2 tabs at 1900 and 2 tabs at 2300) for the hives which are all over her body; she is still itchy and her skin is irritated under the surface; she says that her OB/GYN prescribed antibiotics (metronidazole 500 mg 1 tablet by mouth twice daily) for BV on 01/16/2019  with her last dose 01/22/2019 ; she says that her diet was the same on 01/22/19, but she was on the porch for 5 minutes; recommendations per nurse triage protocol; she verbalized understanding; spoke with Tammy at [facility], and she states that the pt should contact her GYN; the pt was notified, and says "does that mean that my primary care doctor is not going to treat me for my hives?"; explained to the pt that [place] says that she needs to contact the physician that prescribed the medication for managmet of symptoms the pt would like to be seen by her PCP; pt transferred [facility] to [facility] for scheduling; the pt normally sees Dr Betty Martinique, [facility]; will route to office for notification.       Reason for Disposition . Taking new prescription antibiotic (Exception: finished taking new prescription antibiotic)  Answer Assessment - Initial Assessment Questions 1. MAIN SYMPTOM: "What is your main symptom?" "How bad is it?"       hives 2. RESPIRATORY STATUS: "Are you having difficulty breathing?"  (e.g., yes/no, wheezing, unable to complete a sentence)     no 3. SWALLOWING: "Can you swallow?" (e.g., yes/no, food, fluid, saliva)    no 4. VASCULAR STATUS: "Are you feeling weak?" If so, ask: "Can you stand and walk normally?"   no 5. ONSET: "When did the reaction start?" (Minutes or hours ago)      01/22/2019 6. SUBSTANCE: "What are you reacting to?" "When did the contact occur?"    ? metronidazole 7. PREVIOUS REACTION: "Have you ever reacted to it before?" If so, ask: "What happened that time?"     no 8.  EPINEPHRINE: "Do you have an epinephrine autoinjector (e.g., EpiPen)?"   no  Answer Assessment - Initial Assessment Questions 1. APPEARANCE: "What does the rash look like?"      Red hives 2. LOCATION: "Where is the rash located?"     All over body except face 3. NUMBER: "How many hives are there?"     Too many to count 4. SIZE: "How big are the hives?" (inches, cm, compare to coins) "Do they all look the same or is there lots of variation in shape and size?"     Varied sizes 5. ONSET: "When did the hives begin?" (Hours or days ago)      Hours began 610/2020 at 1700 6. ITCHING: "Does it itch?" If so, ask: "How bad is the itch?"    - MILD: doesn't interfere with normal activities   - MODERATE - SEVERE: interferes with work, school, sleep, or other activities      Moderate to severe 7. RECURRENT PROBLEM: "Have you had hives before?" If so, ask: "When was the last time?" and "What happened that time?"      no 8. TRIGGERS: "Were you exposed to any new food, plant, cosmetic product or animal just before the hives began?"     Taking metronidazole 9. OTHER SYMPTOMS: "Do you have any other symptoms?" (e.g., fever, tongue swelling, difficulty breathing, abdominal pain)     Previously had  nausea, and light headed  10. PREGNANCY: "Is there any chance you are pregnant?" "When was your last menstrual period?"       No birth control pills;  LMP 12/23/2018  Answer Assessment - Initial Assessment Questions 1. APPEARANCE of RASH: "Describe the rash." (e.g., spots, blisters, raised areas, skin peeling, scaly)     *No Answer* 2. SIZE: "How big are the spots?" (e.g., tip of pen, eraser, coin; inches, centimeters)     *No Answer* 3. LOCATION: "Where is the rash located?"     *No Answer* 4. COLOR: "What color is the rash?" (Note: It is difficult to assess rash color in people with darker-colored skin. When this situation occurs, simply ask the caller to describe what they see.)     *No Answer* 5. ONSET:  "When did the rash begin?"     *No Answer* 6. FEVER: "Do you have a fever?" If so, ask: "What is your temperature, how was it measured, and when did it start?"     *No Answer* 7. ITCHING: "Does the rash itch?" If so, ask: "How bad is the itch?" (Scale 1-10; or mild, moderate, severe)     *No Answer* 8. CAUSE: "What do you think is causing the rash?"     *No Answer* 9. NEW MEDICATION: "What new medication are you taking?" (e.g., name of antibiotic) "When did you start taking this medication?".     Metronidazole 500 mg po bid; started 01/16/2019; last dose 01/22/2019 at 1100 10. OTHER SYMPTOMS: "Do you have any other symptoms?" (e.g., sore throat, fever, joint pain)      no 11. PREGNANCY: "Is there any chance you are pregnant?" "When was your last menstrual period?"       *No Answer*  Protocols used: [facility]ASH - WIDESPREAD ON DRUGS-A-AH, ANAPHYLAXIS-A-AH, HIVES-A-AH

## 2019-01-23 NOTE — Discharge Instructions (Addendum)
You may continue to take Benadryl as needed.  Meds ordered this encounter  Medications   dexamethasone (DECADRON) injection 10 mg

## 2019-01-23 NOTE — ED Provider Notes (Signed)
Tammie Hernandez   [id number] 01/23/19 Arrival Time: 4098  ASSESSMENT & PLAN:  1. Urticaria    Meds ordered this encounter  Medications  . dexamethasone (DECADRON) injection 10 mg   Will follow up with PCP or here if worsening or failing to improve as anticipated. Reviewed expectations re: course of current medical issues. Questions answered. Outlined signs and symptoms indicating need for more acute intervention. Patient verbalized understanding. After Visit Summary given.   SUBJECTIVE:  Tammie Hernandez is a 33 y.o. female who presents with a skin complaint.   Location: diffuse Onset: abrupt Duration: < 24 hours Associated pruritis? "a lot" Associated pain? none Progression: stable  Drainage? No  Known trigger? No  New soaps/lotions/topicals/detergents/environmental exposures? No Contacts with similar? No Recent travel? No  Other associated symptoms: none Therapies tried thus far: OTC Benadryl with mild relief Arthralgia or myalgia? none Recent illness? none Fever? none No specific aggravating or alleviating factors reported. No associated SOB/wheezing/n/v. Initial nausea but that has resolved.  ROS: As per HPI.  OBJECTIVE: Vitals:   01/23/19 1340 01/23/19 1342  BP:  124/80  Pulse:  96  Resp:  16  Temp:  98.3 F (36.8 C)  TempSrc:  Oral  SpO2:  99%  Weight: 71.7 kg     General appearance: alert; no distress Oropharynx: moist; no lesions; tongue normal Lungs: clear to auscultation bilaterally Heart: regular rate and rhythm Extremities: no edema Skin: warm and dry; smooth, slightly elevated and erythematous plaques of variable size from her neck down Psychological: alert and cooperative; normal mood and affect  No Known Allergies  Past Medical History:  Diagnosis Date  . Allergy    Social History   Socioeconomic History  . Marital status: Married    Spouse name: Not on file  . Number of children: Not on file  . Years of education:  Not on file  . Highest education level: Not on file  Occupational History  . Not on file  Social Needs  . Financial resource strain: Not on file  . Food insecurity    Worry: Not on file    Inability: Not on file  . Transportation needs    Medical: Not on file    Non-medical: Not on file  Tobacco Use  . Smoking status: Never Smoker  . Smokeless tobacco: Never Used  Substance and Sexual Activity  . Alcohol use: Yes    Comment: occasional  . Drug use: No  . Sexual activity: Yes    Birth control/protection: Pill  Lifestyle  . Physical activity    Days per week: Not on file    Minutes per session: Not on file  . Stress: Not on file  Relationships  . Social Herbalist on phone: Not on file    Gets together: Not on file    Attends religious service: Not on file    Active member of club or organization: Not on file    Attends meetings of clubs or organizations: Not on file    Relationship status: Not on file  . Intimate partner violence    Fear of current or ex partner: Not on file    Emotionally abused: Not on file    Physically abused: Not on file    Forced sexual activity: Not on file  Other Topics Concern  . Not on file  Social History Narrative  . Not on file   Family History  Problem Relation Age of Onset  . Stroke Mother  TIA  . Hypertension Mother   . Diabetes Father   . Hypertension Father   . Cancer Maternal Grandmother        ovarian  . Hypertension Maternal Grandmother   . Hypertension Paternal Grandmother   . Hypertension Paternal Uncle   . Diabetes Maternal Grandfather   . Hypertension Maternal Grandfather   . Hypertension Paternal Grandfather    History reviewed. No pertinent surgical history.   Tammie Hernandez, [place], MD 01/23/19 [phone]

## 2019-01-23 NOTE — ED Triage Notes (Signed)
Pt states she had hives and they started last night. Pt states she  itch all over.

## 2019-03-09 ENCOUNTER — Other Ambulatory Visit: Payer: Self-pay

## 2019-03-09 ENCOUNTER — Encounter (HOSPITAL_COMMUNITY): Payer: Self-pay

## 2019-03-09 ENCOUNTER — Ambulatory Visit (HOSPITAL_COMMUNITY)
Admission: EM | Admit: 2019-03-09 | Discharge: 2019-03-09 | Disposition: A | Discharge status: Home / Self Care | Payer: BLUE CROSS/BLUE SHIELD | Attending: Emergency Medicine | Admitting: Emergency Medicine

## 2019-03-09 ENCOUNTER — Ambulatory Visit (INDEPENDENT_AMBULATORY_CARE_PROVIDER_SITE_OTHER): Payer: BLUE CROSS/BLUE SHIELD

## 2019-03-09 DIAGNOSIS — M545 Low back pain, unspecified: Secondary | ICD-10-CM

## 2019-03-09 DIAGNOSIS — S3992XA Unspecified injury of lower back, initial encounter: Secondary | ICD-10-CM | POA: Diagnosis not present

## 2019-03-09 MED ORDER — CYCLOBENZAPRINE HCL 5 MG PO TABS: 5.0000 mg | ORAL_TABLET | Freq: Two times a day (BID) | ORAL | 0 refills | Status: DC | PRN

## 2019-03-09 MED ORDER — NAPROXEN 500 MG PO TABS: 500.0000 mg | ORAL_TABLET | Freq: Two times a day (BID) | ORAL | 0 refills | Status: DC

## 2019-03-09 MED ORDER — TRAMADOL HCL 50 MG PO TABS: 50.0000 mg | ORAL_TABLET | Freq: Four times a day (QID) | ORAL | 0 refills | Status: DC | PRN

## 2019-03-09 NOTE — Discharge Instructions (Signed)
Naprosyn twice daily with food  You may use flexeril as needed to help with pain. This is a muscle relaxer and causes sedation- please use only at bedtime or when you will be home and not have to drive/work  Tramadol for severe pain  Follow up if pain not resolving or worsening

## 2019-03-09 NOTE — ED Triage Notes (Signed)
Pt states she was walking along and the floor was sweaty and she slipped and fell in the floor. Pt states her back hurts.

## 2019-03-09 NOTE — ED Provider Notes (Signed)
[facility]    CSN: [id number] Arrival date & time: 03/09/19  1414      History   Chief Complaint Chief Complaint  Patient presents with  . Appointment    220  . Fall    HPI Tammie Hernandez is a 33 y.o. female no significant past medical history presenting today for evaluation of back injury.  Patient states that she slipped on a hard tile floor on Friday, she landed directly on her back.  Since she has had lower back/gluteal pain.  She has had pain with any movement.  She states that prior to the injury she has had some mild discomfort in her lower back.  She denies any numbness or tingling, saddle anesthesia.  Denies changes in control of urination or bowel movements.  She feels as if the pain is starting to radiate into her legs, but denies leg weakness.  She has been taking ibuprofen without relief.  Has been using an over-the-counter back brace to help with discomfort.  HPI  Past Medical History:  Diagnosis Date  . Allergy     Patient Active Problem List   Diagnosis Date Noted  . Allergic rhinitis 10/16/2016    History reviewed. No pertinent surgical history.  OB History   No obstetric history on file.      Home Medications    Prior to Admission medications   Medication Sig Start Date End Date Taking? Authorizing Provider  cyclobenzaprine (FLEXERIL) 5 MG tablet Take 1-2 tablets (5-10 mg total) by mouth 2 (two) times daily as needed for muscle spasms. 03/09/19   Aleila Syverson C, PA-C  fluticasone (FLONASE) 50 MCG/ACT nasal spray Place 1 spray into both nostrils 2 (two) times daily. 08/21/18   [place], Betty G, MD  JUNEL FE 1/20 1-20 MG-MCG tablet TAKE 1 TABLET BY MOUTH EVERY DAY 07/04/18   [place], Betty G, MD  naproxen (NAPROSYN) 500 MG tablet Take 1 tablet (500 mg total) by mouth 2 (two) times daily. 03/09/19   Krissi Willaims C, PA-C  traMADol (ULTRAM) 50 MG tablet Take 1 tablet (50 mg total) by mouth every 6 (six) hours as needed for severe pain.  03/09/19   Gor Vestal, Junius CreamerHallie C, PA-C    Family History Family History  Problem Relation Age of Onset  . Stroke Mother        TIA  . Hypertension Mother   . Diabetes Father   . Hypertension Father   . Cancer Maternal Grandmother        ovarian  . Hypertension Maternal Grandmother   . Hypertension Paternal Grandmother   . Hypertension Paternal Uncle   . Diabetes Maternal Grandfather   . Hypertension Maternal Grandfather   . Hypertension Paternal Grandfather     Social History Social History   Tobacco Use  . Smoking status: Never Smoker  . Smokeless tobacco: Never Used  Substance Use Topics  . Alcohol use: Yes    Comment: occasional  . Drug use: No     Allergies   Patient has no known allergies.   Review of Systems Review of Systems  Constitutional: Negative for fever.  Respiratory: Negative for shortness of breath.   Cardiovascular: Negative for chest pain.  Gastrointestinal: Negative for abdominal pain, diarrhea, nausea and vomiting.  Genitourinary: Negative for difficulty urinating, dysuria, flank pain, genital sores, hematuria, menstrual problem, vaginal bleeding, vaginal discharge and vaginal pain.  Musculoskeletal: Positive for back pain and myalgias.  Skin: Negative for rash.  Neurological: Negative for dizziness, light-headedness  and headaches.     Physical Exam Triage Vital Signs ED Triage Vitals  Enc Vitals Group     BP 03/09/19 1432 (!) 132/94     Pulse Rate 03/09/19 1432 100     Resp 03/09/19 1432 18     Temp 03/09/19 1432 98.2 F (36.8 C)     Temp Source 03/09/19 1432 Oral     SpO2 03/09/19 1432 100 %     Weight 03/09/19 1430 155 lb (70.3 kg)     Height --      Head Circumference --      Peak Flow --      Pain Score 03/09/19 1430 8     Pain Loc --      Pain Edu? --      Excl. in GC? --    No data found.  Updated Vital Signs BP (!) 132/94 (BP Location: Right Arm)   Pulse 100   Temp 98.2 F (36.8 C) (Oral)   Resp 18   Wt 155 lb (70.3  kg)   LMP 02/03/2019   SpO2 100%   BMI 24.83 kg/m   Visual Acuity Right Eye Distance:   Left Eye Distance:   Bilateral Distance:    Right Eye Near:   Left Eye Near:    Bilateral Near:     Physical Exam Vitals signs and nursing note reviewed.  Constitutional:      General: She is not in acute distress.    Appearance: She is well-developed.  HENT:     Head: Normocephalic and atraumatic.  Eyes:     Conjunctiva/sclera: Conjunctivae normal.  Neck:     Musculoskeletal: Neck supple.  Cardiovascular:     Rate and Rhythm: Normal rate and regular rhythm.     Heart sounds: No murmur.  Pulmonary:     Effort: Pulmonary effort is normal. No respiratory distress.     Breath sounds: Normal breath sounds.  Abdominal:     Palpations: Abdomen is soft.     Tenderness: There is no abdominal tenderness.  Musculoskeletal:     Comments: Nontender to palpation of cervical, thoracic and lumbar spine midline, mild tenderness to palpation of superior sacral area, no palpable deformity or step-off, nontender throughout bilateral lumbar musculature  Strength in hips and knees 5/5 and equal bilaterally, patellar reflex 1+ bilaterally  Does ambulate slightly slower and appears uncomfortable, no antalgia  Skin:    General: Skin is warm and dry.  Neurological:     Mental Status: She is alert.      UC Treatments / Results  Labs (all labs ordered are listed, but only abnormal results are displayed) Labs Reviewed - No data to display  EKG   Radiology Dg Lumbar Spine Complete  Result Date: 03/09/2019 CLINICAL DATA:  Slip and fall, pain EXAM: LUMBAR SPINE - COMPLETE 4+ VIEW COMPARISON:  None. FINDINGS: No fracture or dislocation of the lumbar spine. Disc spaces and vertebral body heights are preserved. There may be focal facet degenerative disease of L5-S1. No significant abnormality of the overlying abdomen. IMPRESSION: No fracture or dislocation of the lumbar spine. Disc spaces and vertebral  body heights are preserved. There may be focal facet degenerative disease of L5-S1. Lumbar disc and neural foraminal pathology may be further evaluated by MRI if indicated by localizing signs and symptoms. Electronically Signed   By: Lauralyn PrimesAlex  Bibbey M.D.   On: 03/09/2019 15:18    Procedures Procedures (including critical care time)  Medications Ordered in UC Medications -  No data to display  Initial Impression / Assessment and Plan / UC Course  I have reviewed the triage vital signs and the nursing notes.  Pertinent labs & imaging results that were available during my care of the patient were reviewed by me and considered in my medical decision making (see chart for details).     L-spine x-rays negative for acute bony abnormality, possible degenerative disc disease at L5/S1.  Most likely contusion/muscle strain.  Will treat with anti-inflammatories and muscle relaxers.  Did provide a few tablets of tramadol to use for severe pain/nighttime pain.  Recommended follow-up if symptoms not resolving or worsening.  Discussed possible next steps as trial of steroids, physical therapy.  If symptoms not resolving may need to follow-up with orthopedics for further imaging.Discussed strict return precautions. Patient verbalized understanding and is agreeable with plan.  Final Clinical Impressions(s) / UC Diagnoses   Final diagnoses:  Acute bilateral low back pain without sciatica     Discharge Instructions     Naprosyn twice daily with food  You may use flexeril as needed to help with pain. This is a muscle relaxer and causes sedation- please use only at bedtime or when you will be home and not have to drive/work  Tramadol for severe pain  Follow up if pain not resolving or worsening   ED Prescriptions    Medication Sig Dispense Auth. Provider   naproxen (NAPROSYN) 500 MG tablet Take 1 tablet (500 mg total) by mouth 2 (two) times daily. 30 tablet Eliberto Sole C, PA-C   cyclobenzaprine  (FLEXERIL) 5 MG tablet Take 1-2 tablets (5-10 mg total) by mouth 2 (two) times daily as needed for muscle spasms. 24 tablet Stephaie Dardis C, PA-C   traMADol (ULTRAM) 50 MG tablet Take 1 tablet (50 mg total) by mouth every 6 (six) hours as needed for severe pain. 12 tablet Adien Kimmel, [place] C, PA-C     Controlled Substance Prescriptions [place] Controlled Substance Registry consulted? Yes, I have consulted the [place] Controlled Substances Registry for this patient, and feel the risk/benefit ratio today is favorable for proceeding with this prescription for a controlled substance.   Janith Lima, [place] 03/09/19 2052

## 2019-04-05 ENCOUNTER — Other Ambulatory Visit: Payer: Self-pay

## 2019-04-05 DIAGNOSIS — Z20822 Contact with and (suspected) exposure to covid-19: Secondary | ICD-10-CM

## 2019-04-06 LAB — NOVEL CORONAVIRUS, NAA: SARS-CoV-2, NAA: NOT DETECTED

## 2019-04-18 ENCOUNTER — Other Ambulatory Visit: Payer: Self-pay

## 2019-04-18 DIAGNOSIS — Z20822 Contact with and (suspected) exposure to covid-19: Secondary | ICD-10-CM

## 2019-04-18 DIAGNOSIS — R6889 Other general symptoms and signs: Secondary | ICD-10-CM | POA: Diagnosis not present

## 2019-04-20 LAB — NOVEL CORONAVIRUS, NAA: SARS-CoV-2, NAA: NOT DETECTED

## 2019-05-14 DIAGNOSIS — F331 Major depressive disorder, recurrent, moderate: Secondary | ICD-10-CM | POA: Diagnosis not present

## 2019-05-31 ENCOUNTER — Ambulatory Visit (INDEPENDENT_AMBULATORY_CARE_PROVIDER_SITE_OTHER): Payer: BLUE CROSS/BLUE SHIELD

## 2019-05-31 ENCOUNTER — Other Ambulatory Visit: Payer: Self-pay

## 2019-05-31 DIAGNOSIS — Z23 Encounter for immunization: Secondary | ICD-10-CM

## 2019-06-12 DIAGNOSIS — F331 Major depressive disorder, recurrent, moderate: Secondary | ICD-10-CM | POA: Diagnosis not present

## 2019-08-12 ENCOUNTER — Ambulatory Visit: Payer: PRIVATE HEALTH INSURANCE | Attending: Internal Medicine

## 2019-08-12 DIAGNOSIS — U071 COVID-19: Secondary | ICD-10-CM | POA: Diagnosis not present

## 2019-08-12 DIAGNOSIS — R238 Other skin changes: Secondary | ICD-10-CM | POA: Diagnosis not present

## 2019-08-13 LAB — NOVEL CORONAVIRUS, NAA: SARS-CoV-2, NAA: NOT DETECTED

## 2019-08-22 ENCOUNTER — Ambulatory Visit: Payer: PRIVATE HEALTH INSURANCE | Attending: Internal Medicine

## 2019-08-22 ENCOUNTER — Other Ambulatory Visit: Payer: PRIVATE HEALTH INSURANCE

## 2019-08-22 DIAGNOSIS — Z20822 Contact with and (suspected) exposure to covid-19: Secondary | ICD-10-CM

## 2019-08-23 LAB — NOVEL CORONAVIRUS, NAA: SARS-CoV-2, NAA: NOT DETECTED

## 2019-08-28 ENCOUNTER — Ambulatory Visit: Payer: PRIVATE HEALTH INSURANCE | Attending: Internal Medicine

## 2019-08-28 DIAGNOSIS — Z20822 Contact with and (suspected) exposure to covid-19: Secondary | ICD-10-CM

## 2019-08-30 LAB — NOVEL CORONAVIRUS, NAA: SARS-CoV-2, NAA: NOT DETECTED

## 2019-09-22 DIAGNOSIS — Z7189 Other specified counseling: Secondary | ICD-10-CM | POA: Diagnosis not present

## 2019-09-22 DIAGNOSIS — Z0000 Encounter for general adult medical examination without abnormal findings: Secondary | ICD-10-CM | POA: Diagnosis not present

## 2019-09-25 DIAGNOSIS — N946 Dysmenorrhea, unspecified: Secondary | ICD-10-CM | POA: Diagnosis not present

## 2019-09-25 DIAGNOSIS — R7309 Other abnormal glucose: Secondary | ICD-10-CM | POA: Diagnosis not present

## 2019-09-25 DIAGNOSIS — R7989 Other specified abnormal findings of blood chemistry: Secondary | ICD-10-CM | POA: Diagnosis not present

## 2019-09-25 DIAGNOSIS — Z0000 Encounter for general adult medical examination without abnormal findings: Secondary | ICD-10-CM | POA: Diagnosis not present

## 2019-10-30 DIAGNOSIS — Z3200 Encounter for pregnancy test, result unknown: Secondary | ICD-10-CM | POA: Diagnosis not present

## 2019-10-30 DIAGNOSIS — Z3689 Encounter for other specified antenatal screening: Secondary | ICD-10-CM | POA: Diagnosis not present

## 2019-11-19 ENCOUNTER — Ambulatory Visit: Payer: PRIVATE HEALTH INSURANCE | Attending: Internal Medicine

## 2019-11-19 DIAGNOSIS — Z20822 Contact with and (suspected) exposure to covid-19: Secondary | ICD-10-CM

## 2019-11-20 LAB — NOVEL CORONAVIRUS, NAA: SARS-CoV-2, NAA: NOT DETECTED

## 2019-11-20 LAB — SARS-COV-2, NAA 2 DAY TAT

## 2019-11-24 DIAGNOSIS — O020 Blighted ovum and nonhydatidiform mole: Secondary | ICD-10-CM | POA: Diagnosis not present

## 2019-11-26 DIAGNOSIS — O020 Blighted ovum and nonhydatidiform mole: Secondary | ICD-10-CM | POA: Diagnosis not present

## 2019-12-01 DIAGNOSIS — O021 Missed abortion: Secondary | ICD-10-CM | POA: Diagnosis not present

## 2019-12-08 ENCOUNTER — Ambulatory Visit: Payer: PRIVATE HEALTH INSURANCE | Attending: Internal Medicine

## 2019-12-08 DIAGNOSIS — Z23 Encounter for immunization: Secondary | ICD-10-CM

## 2019-12-08 NOTE — Progress Notes (Signed)
   Covid-19 Vaccination Clinic  Name:  Tammie Hernandez    MRN: [id number] DOB: [birth year]  12/08/2019  [name] was observed post Covid-19 immunization for 15 minutes without incident. She was provided with Vaccine Information Sheet and instruction to access the V-Safe system.   [name] was instructed to call 911 with any severe reactions post vaccine: Marland Kitchen Difficulty breathing  . Swelling of face and throat  . A fast heartbeat  . A bad rash all over body  . Dizziness and weakness   Immunizations Administered    Name Date Dose VIS Date Route   Pfizer COVID-19 Vaccine 12/08/2019 11:17 AM 0.3 mL 10/08/2018 Intramuscular   Manufacturer: [employer], [employer]   Lot: [id number]   NDC: [address]-7

## 2019-12-30 ENCOUNTER — Ambulatory Visit: Payer: PRIVATE HEALTH INSURANCE | Attending: Internal Medicine

## 2019-12-30 DIAGNOSIS — Z23 Encounter for immunization: Secondary | ICD-10-CM

## 2019-12-30 NOTE — Progress Notes (Signed)
   Covid-19 Vaccination Clinic  Name:  Tammie Hernandez    MRN: [id number] DOB: Jun 25, 1986  12/30/2019  [name] was observed post Covid-19 immunization for 15 minutes without incident. She was provided with Vaccine Information Sheet and instruction to access the V-Safe system.   [name] was instructed to call 911 with any severe reactions post vaccine: Marland Kitchen Difficulty breathing  . Swelling of face and throat  . A fast heartbeat  . A bad rash all over body  . Dizziness and weakness   Immunizations Administered    Name Date Dose VIS Date Route   Pfizer COVID-19 Vaccine 12/30/2019 11:54 AM 0.3 mL 10/08/2018 Intramuscular   Manufacturer: [employer], [employer]   Lot: [id number]   NDC: [address]-2

## 2020-02-03 DIAGNOSIS — H52223 Regular astigmatism, bilateral: Secondary | ICD-10-CM | POA: Diagnosis not present

## 2020-02-19 DIAGNOSIS — F411 Generalized anxiety disorder: Secondary | ICD-10-CM | POA: Diagnosis not present

## 2020-02-19 DIAGNOSIS — F4323 Adjustment disorder with mixed anxiety and depressed mood: Secondary | ICD-10-CM | POA: Diagnosis not present

## 2020-02-19 DIAGNOSIS — Z630 Problems in relationship with spouse or partner: Secondary | ICD-10-CM | POA: Diagnosis not present

## 2020-03-01 DIAGNOSIS — F411 Generalized anxiety disorder: Secondary | ICD-10-CM | POA: Diagnosis not present

## 2020-03-01 DIAGNOSIS — Z630 Problems in relationship with spouse or partner: Secondary | ICD-10-CM | POA: Diagnosis not present

## 2020-03-01 DIAGNOSIS — F4323 Adjustment disorder with mixed anxiety and depressed mood: Secondary | ICD-10-CM | POA: Diagnosis not present

## 2020-03-10 ENCOUNTER — Ambulatory Visit: Payer: PRIVATE HEALTH INSURANCE | Attending: Internal Medicine

## 2020-03-10 DIAGNOSIS — Z20822 Contact with and (suspected) exposure to covid-19: Secondary | ICD-10-CM | POA: Diagnosis not present

## 2020-03-11 LAB — SARS-COV-2, NAA 2 DAY TAT

## 2020-03-11 LAB — NOVEL CORONAVIRUS, NAA: SARS-CoV-2, NAA: NOT DETECTED

## 2020-04-14 ENCOUNTER — Other Ambulatory Visit: Payer: PRIVATE HEALTH INSURANCE

## 2020-04-14 ENCOUNTER — Other Ambulatory Visit: Payer: Self-pay

## 2020-04-14 DIAGNOSIS — Z20822 Contact with and (suspected) exposure to covid-19: Secondary | ICD-10-CM | POA: Diagnosis not present

## 2020-04-15 LAB — NOVEL CORONAVIRUS, NAA: SARS-CoV-2, NAA: NOT DETECTED

## 2020-04-26 DIAGNOSIS — F418 Other specified anxiety disorders: Secondary | ICD-10-CM | POA: Diagnosis not present

## 2020-05-14 DIAGNOSIS — N938 Other specified abnormal uterine and vaginal bleeding: Secondary | ICD-10-CM | POA: Diagnosis not present

## 2020-05-14 DIAGNOSIS — Z124 Encounter for screening for malignant neoplasm of cervix: Secondary | ICD-10-CM | POA: Diagnosis not present

## 2020-05-14 DIAGNOSIS — N898 Other specified noninflammatory disorders of vagina: Secondary | ICD-10-CM | POA: Diagnosis not present

## 2020-05-14 DIAGNOSIS — Z01419 Encounter for gynecological examination (general) (routine) without abnormal findings: Secondary | ICD-10-CM | POA: Diagnosis not present

## 2020-06-15 DIAGNOSIS — R87610 Atypical squamous cells of undetermined significance on cytologic smear of cervix (ASC-US): Secondary | ICD-10-CM | POA: Diagnosis not present

## 2020-06-15 DIAGNOSIS — N870 Mild cervical dysplasia: Secondary | ICD-10-CM | POA: Diagnosis not present

## 2020-06-15 DIAGNOSIS — R87810 Cervical high risk human papillomavirus (HPV) DNA test positive: Secondary | ICD-10-CM | POA: Diagnosis not present

## 2020-08-02 DIAGNOSIS — J00 Acute nasopharyngitis [common cold]: Secondary | ICD-10-CM | POA: Diagnosis not present

## 2020-09-16 DIAGNOSIS — R7989 Other specified abnormal findings of blood chemistry: Secondary | ICD-10-CM | POA: Diagnosis not present

## 2020-09-16 DIAGNOSIS — R7309 Other abnormal glucose: Secondary | ICD-10-CM | POA: Diagnosis not present

## 2020-09-16 DIAGNOSIS — Z0000 Encounter for general adult medical examination without abnormal findings: Secondary | ICD-10-CM | POA: Diagnosis not present

## 2020-09-23 DIAGNOSIS — R7309 Other abnormal glucose: Secondary | ICD-10-CM | POA: Diagnosis not present

## 2020-09-23 DIAGNOSIS — R7989 Other specified abnormal findings of blood chemistry: Secondary | ICD-10-CM | POA: Diagnosis not present

## 2020-09-23 DIAGNOSIS — Z0000 Encounter for general adult medical examination without abnormal findings: Secondary | ICD-10-CM | POA: Diagnosis not present

## 2020-10-03 DIAGNOSIS — H10012 Acute follicular conjunctivitis, left eye: Secondary | ICD-10-CM | POA: Diagnosis not present

## 2021-02-25 IMAGING — DX LUMBAR SPINE - COMPLETE 4+ VIEW
5 series · 5 of 5 positions shown · non-contrast
Comparison: None.

CLINICAL DATA: Slip and fall, pain

EXAM:
LUMBAR SPINE - COMPLETE 4+ VIEW

[l-spine ap]
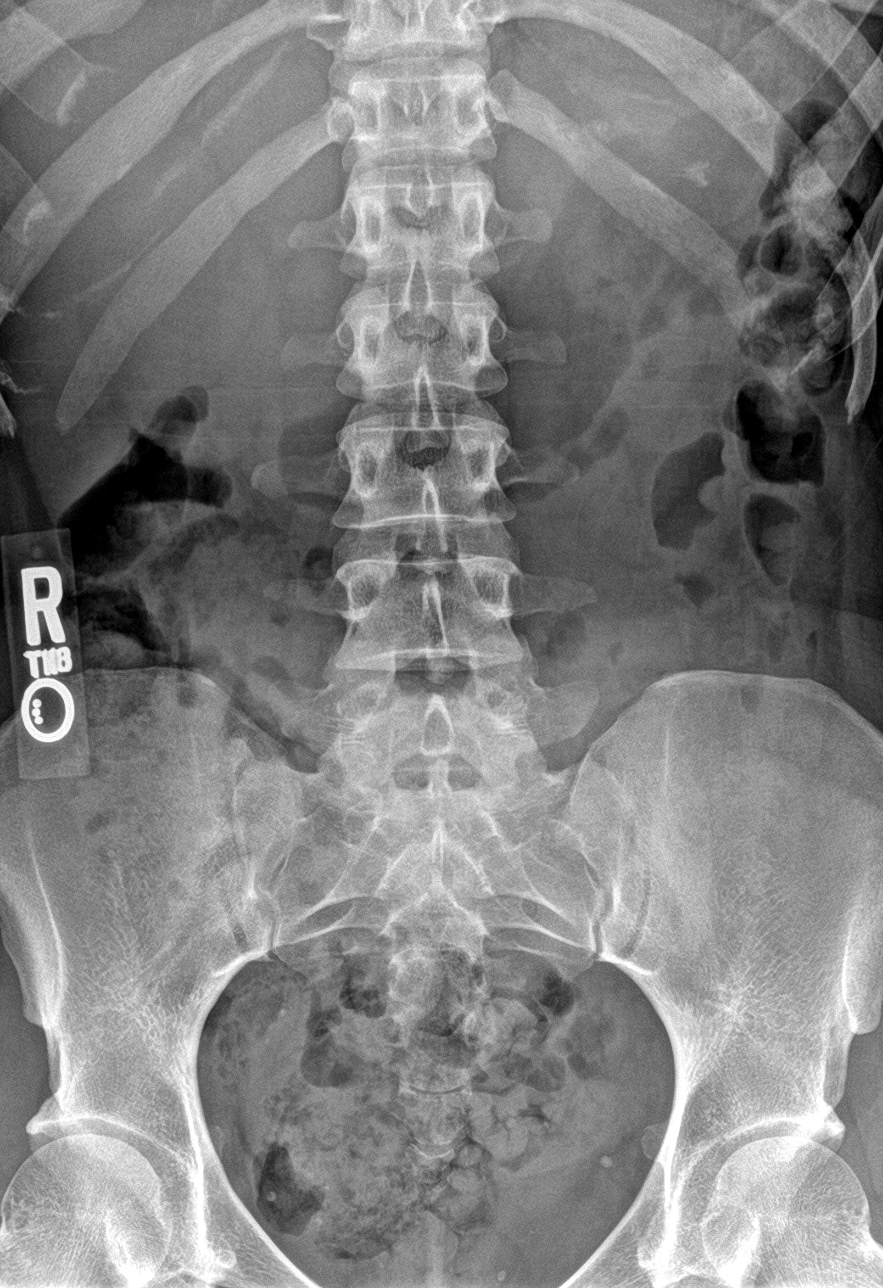

[l-spine obl (1 of 2)]
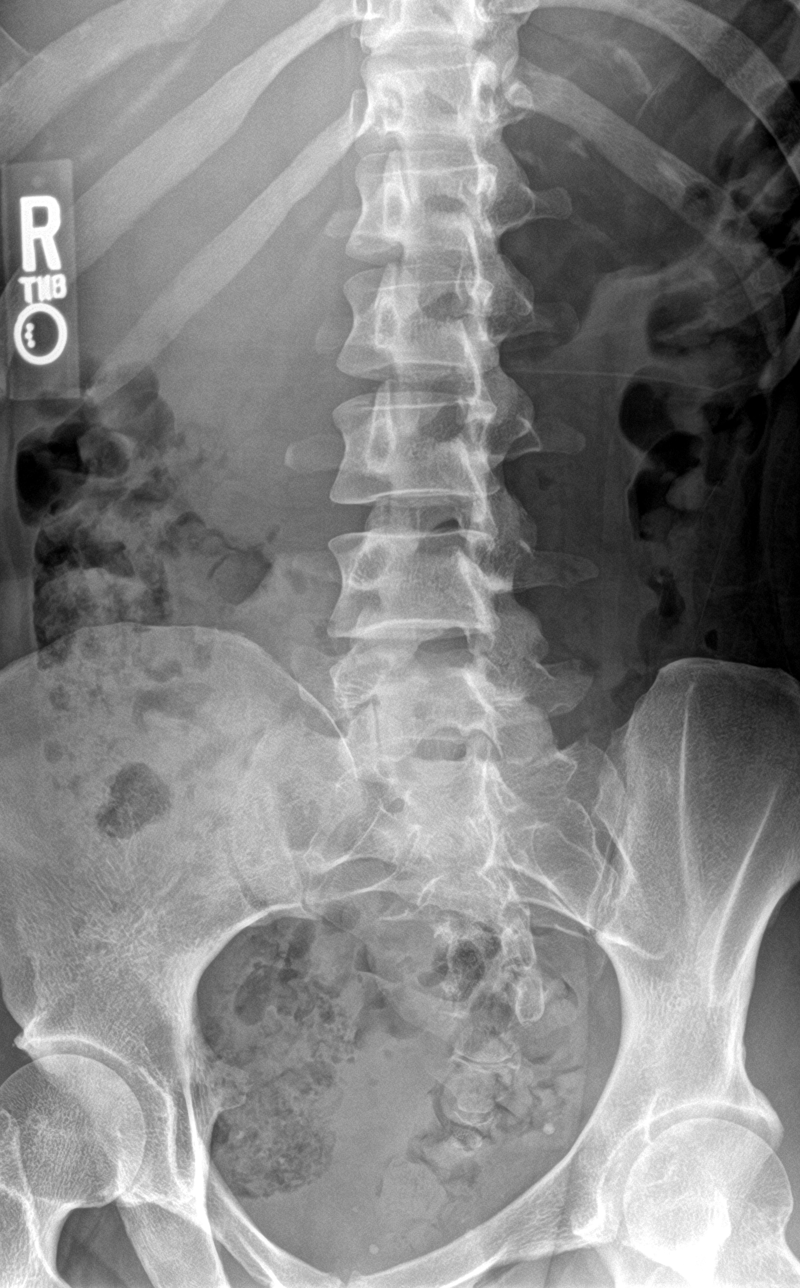

[l-spine obl (2 of 2)]
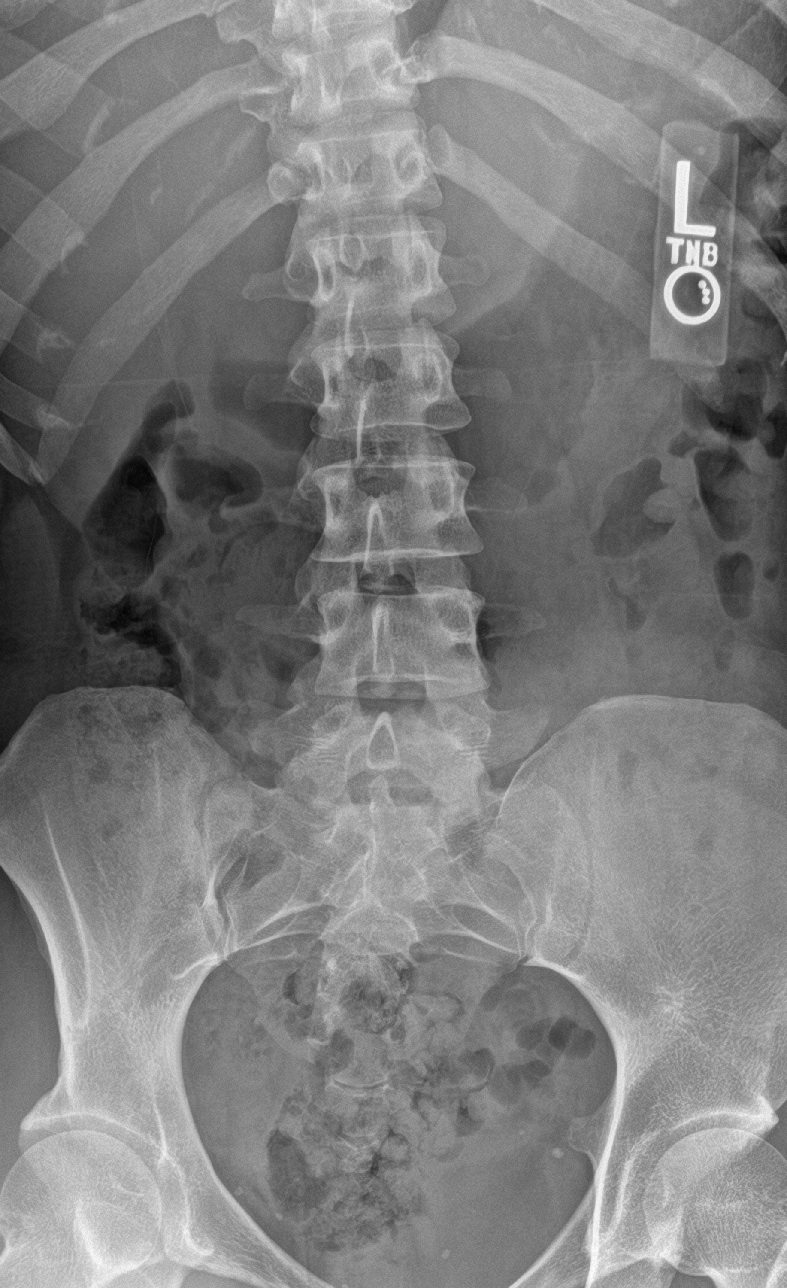

[l-spine lat]
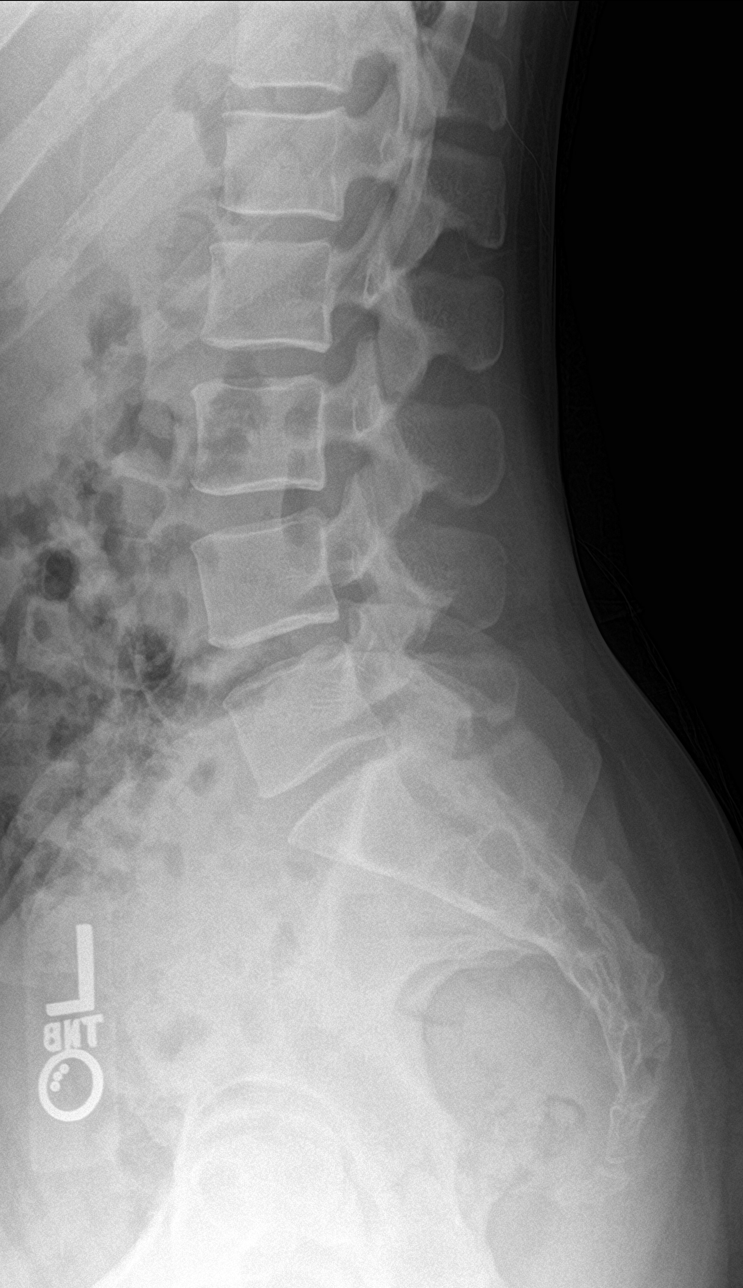

[l-spine spot]
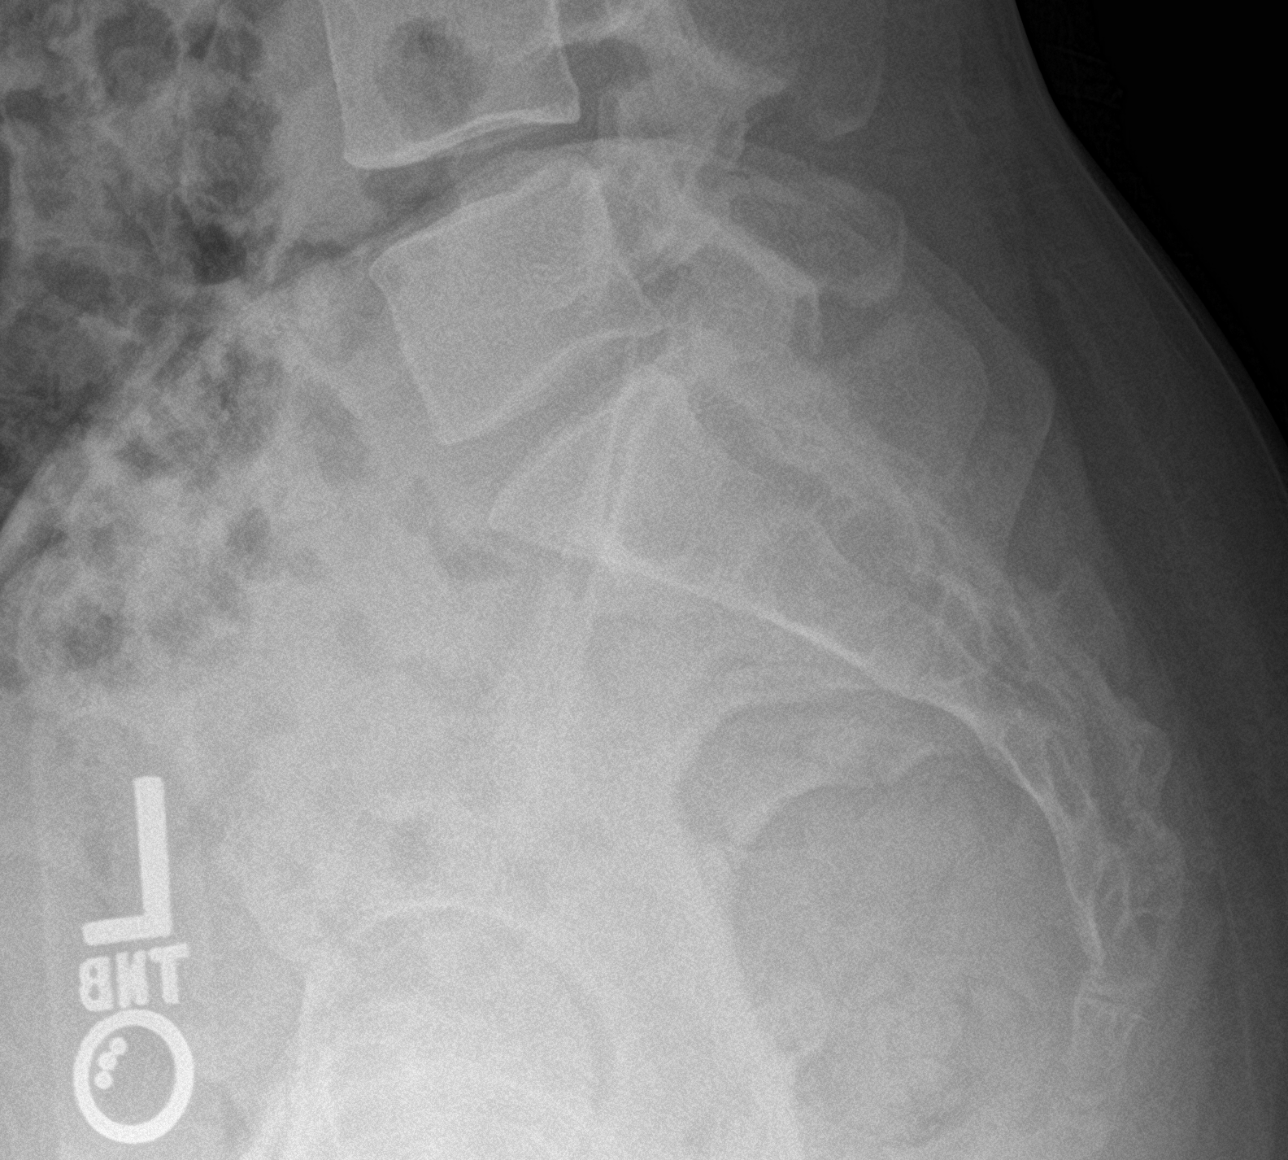

[5 of 5 positions shown; findings below may reference images not displayed]

FINDINGS: No fracture or dislocation of the lumbar spine. Disc spaces and
vertebral body heights are preserved. There may be focal facet
degenerative disease of L5-S1. No significant abnormality of the
overlying abdomen.
IMPRESSION: No fracture or dislocation of the lumbar spine. Disc spaces and
vertebral body heights are preserved. There may be focal facet
degenerative disease of L5-S1. Lumbar disc and neural foraminal
pathology may be further evaluated by MRI if indicated by localizing
signs and symptoms.

## 2021-06-14 DIAGNOSIS — N870 Mild cervical dysplasia: Secondary | ICD-10-CM | POA: Diagnosis not present

## 2021-06-14 DIAGNOSIS — Z01419 Encounter for gynecological examination (general) (routine) without abnormal findings: Secondary | ICD-10-CM | POA: Diagnosis not present

## 2021-06-27 DIAGNOSIS — R030 Elevated blood-pressure reading, without diagnosis of hypertension: Secondary | ICD-10-CM | POA: Diagnosis not present

## 2021-07-20 DIAGNOSIS — I10 Essential (primary) hypertension: Secondary | ICD-10-CM | POA: Diagnosis not present

## 2021-08-02 DIAGNOSIS — H52223 Regular astigmatism, bilateral: Secondary | ICD-10-CM | POA: Diagnosis not present

## 2021-08-14 NOTE — L&D Delivery Note (Signed)
Delivery Note At 5:21 AM Tammie viable and healthy female was delivered via Vaginal, Spontaneous (Presentation: vtx Right Occiput Anterior).  APGAR: 8, 9; weight  pending.   Placenta status: Spontaneous;Pathology, Intact. pathology Cord:  CAN x 1 reducible 3 vessels with the following complications: None.  Cord pH: none  Anesthesia: Epidural Episiotomy: None Lacerations:  none Suture Repair:  n/Tammie Est. Blood Loss (mL): 151  Mom to postpartum.  Baby to Couplet care / Skin to Skin.  Tammie Hernandez Tammie Hernandez 06/30/2022, 5:55 AM

## 2021-09-29 LAB — HEPATITIS C ANTIBODY: HCV Ab: NEGATIVE

## 2021-12-22 LAB — OB RESULTS CONSOLE RUBELLA ANTIBODY, IGM: Rubella: IMMUNE

## 2021-12-22 LAB — OB RESULTS CONSOLE GC/CHLAMYDIA
Chlamydia: NEGATIVE
Neisseria Gonorrhea: NEGATIVE

## 2021-12-22 LAB — OB RESULTS CONSOLE HEPATITIS B SURFACE ANTIGEN: Hepatitis B Surface Ag: NEGATIVE

## 2021-12-22 LAB — OB RESULTS CONSOLE ANTIBODY SCREEN: Antibody Screen: NEGATIVE

## 2021-12-22 LAB — OB RESULTS CONSOLE GBS: GBS: NEGATIVE

## 2021-12-22 LAB — HEPATITIS C ANTIBODY: HCV Ab: NEGATIVE

## 2021-12-22 LAB — OB RESULTS CONSOLE ABO/RH: RH Type: POSITIVE

## 2021-12-22 LAB — OB RESULTS CONSOLE RPR: RPR: NONREACTIVE

## 2021-12-22 LAB — OB RESULTS CONSOLE HIV ANTIBODY (ROUTINE TESTING): HIV: NONREACTIVE

## 2022-06-15 ENCOUNTER — Telehealth (HOSPITAL_COMMUNITY): Payer: Self-pay | Admitting: *Deleted

## 2022-06-15 ENCOUNTER — Encounter (HOSPITAL_COMMUNITY): Payer: Self-pay | Admitting: *Deleted

## 2022-06-15 NOTE — Telephone Encounter (Signed)
Preadmission screen  

## 2022-06-29 ENCOUNTER — Inpatient Hospital Stay (HOSPITAL_COMMUNITY)
Admission: RE | Admit: 2022-06-29 | Discharge: 2022-07-01 | DRG: 807 | Disposition: A | Discharge status: Home / Self Care | Payer: 59 | Attending: Obstetrics and Gynecology | Admitting: Obstetrics and Gynecology

## 2022-06-29 ENCOUNTER — Inpatient Hospital Stay (HOSPITAL_COMMUNITY): Payer: 59 | Admitting: Anesthesiology

## 2022-06-29 ENCOUNTER — Other Ambulatory Visit: Payer: Self-pay

## 2022-06-29 ENCOUNTER — Encounter (HOSPITAL_COMMUNITY): Payer: Self-pay | Admitting: Obstetrics and Gynecology

## 2022-06-29 ENCOUNTER — Inpatient Hospital Stay (HOSPITAL_COMMUNITY): Payer: 59

## 2022-06-29 DIAGNOSIS — Z3A39 39 weeks gestation of pregnancy: Secondary | ICD-10-CM | POA: Diagnosis not present

## 2022-06-29 DIAGNOSIS — O99214 Obesity complicating childbirth: Secondary | ICD-10-CM | POA: Diagnosis present

## 2022-06-29 DIAGNOSIS — O1002 Pre-existing essential hypertension complicating childbirth: Secondary | ICD-10-CM | POA: Diagnosis present

## 2022-06-29 DIAGNOSIS — O10919 Unspecified pre-existing hypertension complicating pregnancy, unspecified trimester: Principal | ICD-10-CM | POA: Diagnosis present

## 2022-06-29 LAB — CBC
HCT: 39.4 % (ref 36.0–46.0)
HCT: 35.1 % — ABNORMAL LOW (ref 36.0–46.0)
Hemoglobin: 13.8 g/dL (ref 12.0–15.0)
Hemoglobin: 12 g/dL (ref 12.0–15.0)
MCH: 32.7 pg (ref 26.0–34.0)
MCH: 32.3 pg (ref 26.0–34.0)
MCHC: 35 g/dL (ref 30.0–36.0)
MCHC: 34.2 g/dL (ref 30.0–36.0)
MCV: 93.4 fL (ref 80.0–100.0)
MCV: 94.6 fL (ref 80.0–100.0)
Platelets: 258 10*3/uL (ref 150–400)
Platelets: 245 10*3/uL (ref 150–400)
RBC: 4.22 MIL/uL (ref 3.87–5.11)
RBC: 3.71 MIL/uL — ABNORMAL LOW (ref 3.87–5.11)
RDW: 12.6 % (ref 11.5–15.5)
RDW: 12.5 % (ref 11.5–15.5)
WBC: 7.1 10*3/uL (ref 4.0–10.5)
WBC: 6.6 10*3/uL (ref 4.0–10.5)
nRBC: 0 % (ref 0.0–0.2)
nRBC: 0 % (ref 0.0–0.2)

## 2022-06-29 LAB — COMPREHENSIVE METABOLIC PANEL
ALT: 23 U/L (ref 0–44)
AST: 42 U/L — ABNORMAL HIGH (ref 15–41)
Albumin: 3.5 g/dL (ref 3.5–5.0)
Alkaline Phosphatase: 151 U/L — ABNORMAL HIGH (ref 38–126)
Anion gap: 12 (ref 5–15)
BUN: 6 mg/dL (ref 6–20)
CO2: 20 mmol/L — ABNORMAL LOW (ref 22–32)
Calcium: 9.3 mg/dL (ref 8.9–10.3)
Chloride: 102 mmol/L (ref 98–111)
Creatinine, Ser: 0.65 mg/dL (ref 0.44–1.00)
GFR, Estimated: >60 mL/min (ref >60)
Glucose, Bld: 81 mg/dL (ref 70–99)
Potassium: 3.2 mmol/L — ABNORMAL LOW (ref 3.5–5.1)
Sodium: 134 mmol/L — ABNORMAL LOW (ref 135–145)
Total Bilirubin: 1.1 mg/dL (ref 0.3–1.2)
Total Protein: 7.6 g/dL (ref 6.5–8.1)

## 2022-06-29 LAB — TYPE AND SCREEN
ABO/RH(D): A POS
Antibody Screen: NEGATIVE

## 2022-06-29 LAB — PROTEIN / CREATININE RATIO, URINE
Creatinine, Urine: 226 mg/dL
Protein Creatinine Ratio: 0.12 mg/mg{Cre} (ref 0.00–0.15)
Total Protein, Urine: 28 mg/dL

## 2022-06-29 LAB — HIV ANTIBODY (ROUTINE TESTING W REFLEX): HIV Screen 4th Generation wRfx: NONREACTIVE

## 2022-06-29 LAB — RPR: RPR Ser Ql: NONREACTIVE

## 2022-06-29 LAB — URIC ACID: Uric Acid, Serum: 6.4 mg/dL (ref 2.5–7.1)

## 2022-06-29 MED ORDER — MISOPROSTOL 50MCG HALF TABLET
50.0000 ug | ORAL_TABLET | ORAL | Status: DC | PRN
  Administered 2022-06-29: 50 ug via ORAL
  Filled 2022-06-29: qty 1

## 2022-06-29 MED ORDER — LIDOCAINE HCL (PF) 1 % IJ SOLN: 30.0000 mL | INTRAMUSCULAR | Status: DC | PRN

## 2022-06-29 MED ORDER — TERBUTALINE SULFATE 1 MG/ML IJ SOLN: 0.2500 mg | Freq: Once | INTRAMUSCULAR | Status: DC | PRN

## 2022-06-29 MED ORDER — OXYCODONE-ACETAMINOPHEN 5-325 MG PO TABS: 1.0000 | ORAL_TABLET | ORAL | Status: DC | PRN

## 2022-06-29 MED ORDER — EPHEDRINE 5 MG/ML INJ: 10.0000 mg | INTRAVENOUS | Status: DC | PRN

## 2022-06-29 MED ORDER — LACTATED RINGERS IV SOLN: INTRAVENOUS | Status: DC

## 2022-06-29 MED ORDER — PHENYLEPHRINE 80 MCG/ML (10ML) SYRINGE FOR IV PUSH (FOR BLOOD PRESSURE SUPPORT): 80.0000 ug | PREFILLED_SYRINGE | INTRAVENOUS | Status: DC | PRN

## 2022-06-29 MED ORDER — LACTATED RINGERS IV SOLN
500.0000 mL | INTRAVENOUS | Status: DC | PRN
  Administered 2022-06-29 – 2022-06-30 (×2): 500 mL via INTRAVENOUS

## 2022-06-29 MED ORDER — LIDOCAINE HCL (PF) 1 % IJ SOLN
INTRAMUSCULAR | Status: DC | PRN
  Administered 2022-06-29 (×2): 4 mL via EPIDURAL

## 2022-06-29 MED ORDER — OXYTOCIN-SODIUM CHLORIDE 30-0.9 UT/500ML-% IV SOLN
1.0000 m[IU]/min | INTRAVENOUS | Status: DC
  Administered 2022-06-29: 2 m[IU]/min via INTRAVENOUS

## 2022-06-29 MED ORDER — DIPHENHYDRAMINE HCL 50 MG/ML IJ SOLN: 12.5000 mg | INTRAMUSCULAR | Status: DC | PRN

## 2022-06-29 MED ORDER — OXYCODONE-ACETAMINOPHEN 5-325 MG PO TABS: 2.0000 | ORAL_TABLET | ORAL | Status: DC | PRN

## 2022-06-29 MED ORDER — FENTANYL-BUPIVACAINE-NACL 0.5-0.125-0.9 MG/250ML-% EP SOLN
12.0000 mL/h | EPIDURAL | Status: DC | PRN
  Administered 2022-06-29: 12 mL/h via EPIDURAL
  Filled 2022-06-29: qty 250

## 2022-06-29 MED ORDER — LACTATED RINGERS IV SOLN
500.0000 mL | Freq: Once | INTRAVENOUS | Status: AC
  Administered 2022-06-29: 500 mL via INTRAVENOUS

## 2022-06-29 MED ORDER — OXYTOCIN 10 UNIT/ML IJ SOLN: 10.0000 [IU] | Freq: Once | INTRAMUSCULAR | Status: DC

## 2022-06-29 MED ORDER — OXYTOCIN-SODIUM CHLORIDE 30-0.9 UT/500ML-% IV SOLN
2.5000 [IU]/h | INTRAVENOUS | Status: DC
  Administered 2022-06-30: 2.5 [IU]/h via INTRAVENOUS
  Filled 2022-06-29: qty 500

## 2022-06-29 MED ORDER — ONDANSETRON HCL 4 MG/2ML IJ SOLN: 4.0000 mg | Freq: Four times a day (QID) | INTRAMUSCULAR | Status: DC | PRN

## 2022-06-29 MED ORDER — SOD CITRATE-CITRIC ACID 500-334 MG/5ML PO SOLN: 30.0000 mL | ORAL | Status: DC | PRN

## 2022-06-29 MED ORDER — LACTATED RINGERS AMNIOINFUSION: INTRAVENOUS | Status: DC

## 2022-06-29 MED ORDER — OXYTOCIN BOLUS FROM INFUSION
333.0000 mL | Freq: Once | INTRAVENOUS | Status: AC
  Administered 2022-06-30: 333 mL via INTRAVENOUS

## 2022-06-29 MED ORDER — ACETAMINOPHEN 325 MG PO TABS: 650.0000 mg | ORAL_TABLET | ORAL | Status: DC | PRN

## 2022-06-29 MED ORDER — NIFEDIPINE ER OSMOTIC RELEASE 30 MG PO TB24
30.0000 mg | ORAL_TABLET | Freq: Every day | ORAL | Status: DC
  Administered 2022-06-29 – 2022-07-01 (×3): 30 mg via ORAL
  Filled 2022-06-29 (×4): qty 1

## 2022-06-29 NOTE — Anesthesia Procedure Notes (Signed)
Epidural Patient location during procedure: OB Start time: 06/29/2022 11:42 PM End time: 06/29/2022 11:45 PM  Staffing Anesthesiologist: Beryle Lathe, MD Performed: anesthesiologist   Preanesthetic Checklist Completed: patient identified, IV checked, risks and benefits discussed, monitors and equipment checked, pre-op evaluation and timeout performed  Epidural Patient position: sitting Prep: DuraPrep Patient monitoring: continuous pulse ox and blood pressure Approach: midline Location: L2-L3 Injection technique: LOR saline  Needle:  Needle type: Tuohy  Needle gauge: 17 G Needle length: 9 cm Needle insertion depth: 4 cm Catheter size: 19 Gauge Catheter at skin depth: 9 cm Test dose: negative and Other (1% lidocaine)  Assessment Events: blood not aspirated  Additional Notes Patient identified. Risks including, but not limited to, bleeding, infection, nerve damage, paralysis, inadequate analgesia, blood pressure changes, nausea, vomiting, allergic reaction, postpartum back pain, itching, and headache were discussed. Patient expressed understanding and wished to proceed. Sterile prep and drape, including hand hygiene, mask, and sterile gloves were used. The patient was positioned and the spine was prepped. The skin was anesthetized with lidocaine. No paraesthesia or other complication noted. The patient did not experience any signs of intravascular injection such as tinnitus or metallic taste in mouth, nor signs of intrathecal spread such as rapid motor block. Please see nursing notes for vital signs. The patient tolerated the procedure well.   Leslye Peer, MDReason for block:procedure for pain

## 2022-06-29 NOTE — Progress Notes (Signed)
S: notes some cramping S/P Cytotec x 1   O: VS  BP 122/81 P77 Pitocin VE 1/50/-2 deviated to left  Tracing baseline 140 (+) accel to 160 Good variability Ctx q 2-4 mins  IMP: chronic HTN  Term gestation Latent phase P) continue Pitocin

## 2022-06-29 NOTE — Progress Notes (Signed)
Tammie Hernandez is Tammie 36 y.o. G3P0011 at [redacted]w[redacted]d by LMP admitted for induction of labor due to Hypertension.  Subjective: No chief complaint on file.   Objective: BP 113/63   Pulse 76   Temp 98.6 F (37 C) (Oral)   Resp 16   Ht 5\' 5"  (1.651 m)   Wt 83.2 kg   BMI 30.52 kg/m  No intake/output data recorded. No intake/output data recorded.  FHT:  FHR: 150 bpm, variability: moderate,  accelerations:  Present,  decelerations:  Absent UC:   irregular, every 3-5 minutes SVE:   unchanged Tracing cat 1  Labs: Lab Results  Component Value Date   WBC 7.1 06/29/2022   HGB 13.8 06/29/2022   HCT 39.4 06/29/2022   MCV 93.4 06/29/2022   PLT 258 06/29/2022    Assessment / Plan: Protracted latent phase Chronic HTN controlled on med Term P) continue with pitocin. Reassess at midnight   Anticipated MOD:  NSVD  Tammie Hernandez Tammie Hernandez 06/29/2022, 5:42 PM

## 2022-06-29 NOTE — H&P (Signed)
Tammie Hernandez is a 36 y.o. female presenting for IOL @ term gestation due to chronic HTN controlled on med. OB History     Gravida  3   Para  1   Term      Preterm      AB  1   Living  1      SAB  1   IAB      Ectopic      Multiple      Live Births  1          Past Medical History:  Diagnosis Date   Allergy    Hypertension    History reviewed. No pertinent surgical history. Family History: family history includes Diabetes in her father and maternal grandmother; Hypertension in her father, maternal grandfather, maternal grandmother, mother, paternal grandfather, paternal grandmother, and paternal uncle; Stroke in her mother. Social History:  reports that she has never smoked. She has never used smokeless tobacco. She reports current alcohol use. She reports that she does not use drugs.     Maternal Diabetes: No Genetic Screening: Normal Maternal Ultrasounds/Referrals: Normal Fetal Ultrasounds or other Referrals:  None Maternal Substance Abuse:  No Significant Maternal Medications:  Meds include: Other: procardia Significant Maternal Lab Results:  Group B Strep negative Number of Prenatal Visits:greater than 3 verified prenatal visits Other Comments:   chronic HTN  Review of Systems  All other systems reviewed and are negative.  History Dilation: 1 Effacement (%): 50 Station: -2 Exam by:: m wilkins rnc Blood pressure 134/88, pulse 92, temperature 98.1 F (36.7 C), temperature source Oral, resp. rate 16, height 5\' 5"  (1.651 m), weight 83.2 kg. Exam Physical Exam Constitutional:      Appearance: Normal appearance.  HENT:     Head: Atraumatic.  Eyes:     Extraocular Movements: Extraocular movements intact.  Cardiovascular:     Rate and Rhythm: Regular rhythm.  Pulmonary:     Breath sounds: Normal breath sounds.  Musculoskeletal:     Cervical back: Neck supple.  Skin:    General: Skin is warm and dry.  Neurological:     General: No focal  deficit present.     Mental Status: She is alert and oriented to person, place, and time.  Psychiatric:        Mood and Affect: Mood normal.        Behavior: Behavior normal.     Prenatal labs: ABO, Rh: --/--/PENDING (11/16 0745) Antibody: PENDING (11/16 0745) Rubella: Immune (05/11 0000) RPR: Nonreactive (05/11 0000)  HBsAg: Negative (05/11 0000)  HIV: Non-reactive (05/11 0000)  GBS:   negative  Assessment/Plan: Chronic HTN affecting preg third trim Term gestation P) admit pih labs. Oral cytotec. Epidural prn. Continue procardia   Tammie Hernandez A Tammie Hernandez 06/29/2022, 8:27 AM

## 2022-06-29 NOTE — Anesthesia Preprocedure Evaluation (Signed)
Anesthesia Evaluation  Patient identified by MRN, date of birth, ID band Patient awake    Reviewed: Allergy & Precautions, NPO status , Patient's Chart, lab work & pertinent test results  History of Anesthesia Complications Negative for: history of anesthetic complications  Airway Mallampati: II   Neck ROM: Full    Dental   Pulmonary neg pulmonary ROS   Pulmonary exam normal        Cardiovascular hypertension, Pt. on medications Normal cardiovascular exam     Neuro/Psych negative neurological ROS  negative psych ROS   GI/Hepatic negative GI ROS, Neg liver ROS,,,  Endo/Other   Obesity  Renal/GU negative Renal ROS     Musculoskeletal negative musculoskeletal ROS (+)    Abdominal   Peds  Hematology negative hematology ROS (+)   Anesthesia Other Findings   Reproductive/Obstetrics (+) Pregnancy                             Anesthesia Physical Anesthesia Plan  ASA: 2  Anesthesia Plan: Epidural   Post-op Pain Management:    Induction:   PONV Risk Score and Plan: 2 and Treatment may vary due to age or medical condition  Airway Management Planned: Natural Airway  Additional Equipment: None  Intra-op Plan:   Post-operative Plan:   Informed Consent: I have reviewed the patients History and Physical, chart, labs and discussed the procedure including the risks, benefits and alternatives for the proposed anesthesia with the patient or authorized representative who has indicated his/her understanding and acceptance.       Plan Discussed with: Anesthesiologist  Anesthesia Plan Comments: (Labs reviewed. Platelets acceptable, patient not taking any blood thinning medications. Per RN, FHR tracing reported to be stable enough for sitting procedure. Risks and benefits discussed with patient, including PDPH, backache, epidural hematoma, failed epidural, blood pressure changes, allergic  reaction, and nerve injury. Patient expressed understanding and wished to proceed.)       Anesthesia Quick Evaluation  

## 2022-06-29 NOTE — Progress Notes (Signed)
Tammie Hernandez is a 36 y.o. G3P0011 at [redacted]w[redacted]d by LMP admitted for induction of labor due to Hypertension.  Subjective: Breathing with ctx Waiting for epidural  Objective: BP (!) 148/87 (BP Location: Right Arm)   Pulse (!) 106   Temp 98.4 F (36.9 C)   Resp 18   Ht 5\' 5"  (1.651 m)   Wt 83.2 kg   BMI 30.52 kg/m  No intake/output data recorded. No intake/output data recorded.  FHT:  FHR: 150 bpm, variability: moderate,  accelerations:  Present,  decelerations:  Present some variables UC:   ?irregular, every 2-4 minutes SVE:   4 cm dilated, 80% effaced, -2  station. Deviated to left. Asynclitic IUPC/ISE placed  Labs: Lab Results  Component Value Date   WBC 6.6 06/29/2022   HGB 12.0 06/29/2022   HCT 35.1 (L) 06/29/2022   MCV 94.6 06/29/2022   PLT 245 06/29/2022    Assessment / Plan: Induction of labor due to chronic HTN,  progressing well on pitocin Term gestation P) Epidural. Resume pitocin  Anticipated MOD:  NSVD  Lyndell Gillyard A Carrera Kiesel 06/29/2022, 11:38 PM

## 2022-06-30 ENCOUNTER — Encounter (HOSPITAL_COMMUNITY): Payer: Self-pay | Admitting: Obstetrics and Gynecology

## 2022-06-30 LAB — CBC
HCT: 34.4 % — ABNORMAL LOW (ref 36.0–46.0)
Hemoglobin: 12.3 g/dL (ref 12.0–15.0)
MCH: 32.9 pg (ref 26.0–34.0)
MCHC: 35.8 g/dL (ref 30.0–36.0)
MCV: 92 fL (ref 80.0–100.0)
Platelets: 231 10*3/uL (ref 150–400)
RBC: 3.74 MIL/uL — ABNORMAL LOW (ref 3.87–5.11)
RDW: 12.7 % (ref 11.5–15.5)
WBC: 8 10*3/uL (ref 4.0–10.5)
nRBC: 0 % (ref 0.0–0.2)

## 2022-06-30 MED ORDER — FERROUS SULFATE 325 (65 FE) MG PO TABS
325.0000 mg | ORAL_TABLET | Freq: Two times a day (BID) | ORAL | Status: DC
  Administered 2022-06-30 – 2022-07-01 (×2): 325 mg via ORAL
  Filled 2022-06-30 (×2): qty 1

## 2022-06-30 MED ORDER — ACETAMINOPHEN 325 MG PO TABS
650.0000 mg | ORAL_TABLET | ORAL | Status: DC | PRN
  Administered 2022-06-30 (×2): 650 mg via ORAL
  Filled 2022-06-30 (×2): qty 2

## 2022-06-30 MED ORDER — IBUPROFEN 600 MG PO TABS
600.0000 mg | ORAL_TABLET | Freq: Four times a day (QID) | ORAL | Status: DC
  Administered 2022-06-30 – 2022-07-01 (×6): 600 mg via ORAL
  Filled 2022-06-30 (×7): qty 1

## 2022-06-30 MED ORDER — DIBUCAINE (PERIANAL) 1 % EX OINT: 1.0000 | TOPICAL_OINTMENT | CUTANEOUS | Status: DC | PRN

## 2022-06-30 MED ORDER — DIPHENHYDRAMINE HCL 25 MG PO CAPS: 25.0000 mg | ORAL_CAPSULE | Freq: Four times a day (QID) | ORAL | Status: DC | PRN

## 2022-06-30 MED ORDER — ONDANSETRON HCL 4 MG/2ML IJ SOLN: 4.0000 mg | INTRAMUSCULAR | Status: DC | PRN

## 2022-06-30 MED ORDER — COCONUT OIL OIL: 1.0000 | TOPICAL_OIL | Status: DC | PRN

## 2022-06-30 MED ORDER — ZOLPIDEM TARTRATE 5 MG PO TABS: 5.0000 mg | ORAL_TABLET | Freq: Every evening | ORAL | Status: DC | PRN

## 2022-06-30 MED ORDER — BUPIVACAINE HCL (PF) 0.25 % IJ SOLN
INTRAMUSCULAR | Status: DC | PRN
  Administered 2022-06-30: 6 mL via EPIDURAL

## 2022-06-30 MED ORDER — BENZOCAINE-MENTHOL 20-0.5 % EX AERO
1.0000 | INHALATION_SPRAY | CUTANEOUS | Status: DC | PRN
  Administered 2022-06-30: 1 via TOPICAL
  Filled 2022-06-30: qty 56

## 2022-06-30 MED ORDER — ONDANSETRON HCL 4 MG PO TABS: 4.0000 mg | ORAL_TABLET | ORAL | Status: DC | PRN

## 2022-06-30 MED ORDER — PRENATAL MULTIVITAMIN CH
1.0000 | ORAL_TABLET | Freq: Every day | ORAL | Status: DC
  Administered 2022-06-30 – 2022-07-01 (×2): 1 via ORAL
  Filled 2022-06-30 (×2): qty 1

## 2022-06-30 MED ORDER — SIMETHICONE 80 MG PO CHEW: 80.0000 mg | CHEWABLE_TABLET | ORAL | Status: DC | PRN

## 2022-06-30 MED ORDER — SENNOSIDES-DOCUSATE SODIUM 8.6-50 MG PO TABS
2.0000 | ORAL_TABLET | Freq: Every day | ORAL | Status: DC
  Administered 2022-07-01: 2 via ORAL
  Filled 2022-06-30: qty 2

## 2022-06-30 MED ORDER — WITCH HAZEL-GLYCERIN EX PADS: 1.0000 | MEDICATED_PAD | CUTANEOUS | Status: DC | PRN

## 2022-06-30 NOTE — Progress Notes (Signed)
S: Epidural working  O: VS BP 114/66 P75 Pitocin 20 miu/ml  VE 9/100/+1 asynclitic Tracing: baseline 145 (+) accel to 155-160 Good variability.  Ctx q 1-4 mins   IMP: active phase Chronic HTn Term gestation P) right flying cow girl position. Cont pitocin

## 2022-06-30 NOTE — Lactation Note (Signed)
This note was copied from a baby's chart. Lactation Consultation Note  Patient Name: Girl Raeleen Winstanley [id number] Date: 06/30/2022 Reason for consult: L&D Initial assessment;Term Age:36 hours   Initial L&D Consult:  Visited with family < 1 hour after birth Mother's desire is to pump and bottle feed, however, she was willing to attempt latching.  Assisted to latch baby but she continued to be fussy at the breast; calmed her with my gloved finger and attempted a second time.  She continued to be fussy so placed her STS on mother's chest.  RN in room for assessment.     Maternal Data    Feeding Nipple Type: Slow - flow  LATCH Score Latch: Repeated attempts needed to sustain latch, nipple held in mouth throughout feeding, stimulation needed to elicit sucking reflex.  Audible Swallowing: None  Type of Nipple: Everted at rest and after stimulation  Comfort (Breast/Nipple): Soft / non-tender  Hold (Positioning): Assistance needed to correctly position infant at breast and maintain latch.  LATCH Score: 6   Lactation Tools Discussed/Used    Interventions Interventions: Skin to skin;Assisted with latch  Discharge    Consult Status Consult Status: Follow-up from L&D    Shenelle Klas R Trisha Ken 06/30/2022, 6:29 AM

## 2022-07-01 LAB — CBC
HCT: 30.1 % — ABNORMAL LOW (ref 36.0–46.0)
Hemoglobin: 10.2 g/dL — ABNORMAL LOW (ref 12.0–15.0)
MCH: 32.4 pg (ref 26.0–34.0)
MCHC: 33.9 g/dL (ref 30.0–36.0)
MCV: 95.6 fL (ref 80.0–100.0)
Platelets: 208 10*3/uL (ref 150–400)
RBC: 3.15 MIL/uL — ABNORMAL LOW (ref 3.87–5.11)
RDW: 12.8 % (ref 11.5–15.5)
WBC: 8.5 10*3/uL (ref 4.0–10.5)
nRBC: 0 % (ref 0.0–0.2)

## 2022-07-01 MED ORDER — NIFEDIPINE ER OSMOTIC RELEASE 30 MG PO TB24: 30.0000 mg | ORAL_TABLET | Freq: Every day | ORAL | 11 refills | Status: DC

## 2022-07-01 MED ORDER — IBUPROFEN 600 MG PO TABS: 600.0000 mg | ORAL_TABLET | Freq: Four times a day (QID) | ORAL | 11 refills | Status: DC | PRN

## 2022-07-01 NOTE — Plan of Care (Signed)
  Problem: Education: Goal: Knowledge of condition will improve Outcome: Completed/Met   Problem: Activity: Goal: Will verbalize the importance of balancing activity with adequate rest periods Outcome: Completed/Met Goal: Ability to tolerate increased activity will improve Outcome: Completed/Met   Problem: Coping: Goal: Ability to identify and utilize available resources and services will improve Outcome: Completed/Met   Problem: Life Cycle: Goal: Chance of risk for complications during the postpartum period will decrease Outcome: Completed/Met   Problem: Role Relationship: Goal: Ability to demonstrate positive interaction with newborn will improve Outcome: Completed/Met   Problem: Skin Integrity: Goal: Demonstration of wound healing without infection will improve Outcome: Completed/Met   

## 2022-07-01 NOTE — Lactation Note (Signed)
This note was copied from a baby's chart. Lactation Consultation Note  Patient Name: Girl Kitara Hebb [id number] Date: 07/01/2022   Age:36 hours Per RNCala Bradford) Birth Parent declined [facility] services tonight is supplementing infant with formula her choice. According RN Cala Bradford)  Birth Parent  had bad experienced with [facility] services, Birth Parent felt pressured into BF her 1st child at [facility] and 2 days after her discharge , 1st child was re-admitted due to dehydration.  Maternal Data    Feeding Nipple Type: Extra Slow Flow  LATCH Score                    Lactation Tools Discussed/Used    Interventions    Discharge    Consult Status      Frederico Hamman 07/01/2022, 1:35 AM

## 2022-07-01 NOTE — Lactation Note (Signed)
This note was copied from a baby's chart. Lactation Consultation Note  Patient Name: Tammie Hernandez [id number] Date: 07/01/2022 Reason for consult: Follow-up assessment;Term;Primapara;1st time breastfeeding Age:36 hours   P2 Term infant at 39+1 weeks Feeding preference: Breast/formula/pumping  RN requested a lactation consult.  Spoke with mother regarding her breast feeding goals.  Mother informed me that she had a very negative lactation experience with her first child and that child ended up with dehydration.  She has reservations regarding using a Advertising copywriter exclusively and wants to manage her own feeding plan.  She desires for her baby to be "fed" and does not want the stress of exclusive breast feeding.  Discussed feeding plan with her goals in mind and suggested she continue to latch with every feed prior to giving supplementation to help ensure a good milk supply.  Recommended pumping every three hours.  Mother admitted to not being consistent with pumping and will improve with this today.    "Tammie Hernandez" was on the breast sleeping with a shallow latch.  Offered to assist with better positioning due to the latch "pinching" per mother.  Assisted to latch easily and deeply; mother noted the difference.  "Tammie Hernandez" continued to be sleepy and mother desired for her to be placed in the bassinet.  Suggested she call her RN/LC for latch assistance with the next feeding if desired.  RN updated.    Maternal Data Has patient been taught Hand Expression?: Yes Does the patient have breastfeeding experience prior to this delivery?: No  Feeding Mother's Current Feeding Choice: Breast Milk and Formula Nipple Type: Extra Slow Flow  LATCH Score Latch: Repeated attempts needed to sustain latch, nipple held in mouth throughout feeding, stimulation needed to elicit sucking reflex.  Audible Swallowing: None  Type of Nipple: Everted at rest and after stimulation  Comfort  (Breast/Nipple): Soft / non-tender  Hold (Positioning): Assistance needed to correctly position infant at breast and maintain latch.  LATCH Score: 6   Lactation Tools Discussed/Used    Interventions Interventions: Breast feeding basics reviewed;Assisted with latch;Skin to skin;Breast massage;Hand express;Breast compression;Position options;Support pillows;Adjust position;Education  Discharge Pump: Personal  Consult Status Consult Status: Follow-up Date: 07/02/22 Follow-up type: In-patient    Aniesha Haughn R Brittinee Risk 07/01/2022, 4:58 AM

## 2022-07-01 NOTE — Discharge Instructions (Signed)
Call if temperature greater than equal to 100.4, nothing per vagina for 4-6 weeks or severe nausea vomiting, increased incisional pain , drainage or redness in the incision site, no straining with bowel movements, showers no bath °

## 2022-07-01 NOTE — Progress Notes (Signed)
PPD{NUMBERS 1-5:20334} SVD:   S:  Pt reports feeling ***/ Tolerating po/ Voiding without problems/ No n/v/ Bleeding is {Description; bleeding vaginal:11356}/ Pain controlled with{treatments; pain control med:13496}  Newborn info ***   O:  A & O x 3 ***/ VS: Blood pressure 120/88, pulse 86, temperature 98.2 F (36.8 C), temperature source Oral, resp. rate 16, height 5\' 5"  (1.651 m), weight 83.2 kg, SpO2 100 %, unknown if currently breastfeeding.  LABS:  Results for orders placed or performed during the hospital encounter of 06/29/22 (from the past 24 hour(s))  CBC     Status: Abnormal   Collection Time: 07/01/22  4:39 AM  Result Value Ref Range   WBC 8.5 4.0 - 10.5 K/uL   RBC 3.15 (L) 3.87 - 5.11 MIL/uL   Hemoglobin 10.2 (L) 12.0 - 15.0 g/dL   HCT 07/03/22 (L) 40.9 - 81.1 %   MCV 95.6 80.0 - 100.0 fL   MCH 32.4 26.0 - 34.0 pg   MCHC 33.9 30.0 - 36.0 g/dL   RDW 91.4 78.2 - 95.6 %   Platelets 208 150 - 400 K/uL   nRBC 0.0 0.0 - 0.2 %    I&O: I/O last 3 completed shifts: In: -  Out: 1651 [Urine:1500; Blood:151]   No intake/output data recorded.  Lungs: {Exam; lungs:5033}  Heart: {Exam; heart:5510}  Abdomen: {PE ABDOMEN POSTPARTUM OBGYN:313106}  Perineum: {exam; perineum:10172}  Lochia: ***  Extremities:{pe extremities 21.3    A/P: PPD # ***/ YQ:657846}  Doing well  Continue routine post partum orders  ***PPD{NUMBERS 1-5:20334} SVD:   S:  Pt reports feeling ***/ Tolerating po/ Voiding without problems/ No n/v/ Bleeding is {Description; bleeding vaginal:11356}/ Pain controlled with{treatments; pain control med:13496}  Newborn info ***   O:  A & O x 3 ***/ VS: Blood pressure 120/88, pulse 86, temperature 98.2 F (36.8 C), temperature source Oral, resp. rate 16, height 5\' 5"  (1.651 m), weight 83.2 kg, SpO2 100 %, unknown if currently breastfeeding.  LABS:  Results for orders placed or performed during the hospital encounter of 06/29/22 (from the past 24 hour(s))  CBC      Status: Abnormal   Collection Time: 07/01/22  4:39 AM  Result Value Ref Range   WBC 8.5 4.0 - 10.5 K/uL   RBC 3.15 (L) 3.87 - 5.11 MIL/uL   Hemoglobin 10.2 (L) 12.0 - 15.0 g/dL   HCT 07/01/22 (L) 07/03/22 - 41.3 %   MCV 95.6 80.0 - 100.0 fL   MCH 32.4 26.0 - 34.0 pg   MCHC 33.9 30.0 - 36.0 g/dL   RDW 24.4 01.0 - 27.2 %   Platelets 208 150 - 400 K/uL   nRBC 0.0 0.0 - 0.2 %    I&O: I/O last 3 completed shifts: In: -  Out: 1651 [Urine:1500; Blood:151]   No intake/output data recorded.  Lungs: {Exam; lungs:5033}  Heart: {Exam; heart:5510}  Abdomen: {PE ABDOMEN POSTPARTUM OBGYN:313106}  Perineum: {exam; perineum:10172}  Lochia: ***  Extremities:{pe extremities 53.6    A/P: PPD # ***/ 64.4  Doing well  Continue routine post partum orders  ***

## 2022-07-01 NOTE — Anesthesia Postprocedure Evaluation (Signed)
Anesthesia Post Note  Patient: Environmental education officer  Procedure(s) Performed: AN AD HOC LABOR EPIDURAL     Patient location during evaluation: Mother Baby Anesthesia Type: Epidural Level of consciousness: awake and alert Pain management: pain level controlled Vital Signs Assessment: post-procedure vital signs reviewed and stable Respiratory status: spontaneous breathing, nonlabored ventilation and respiratory function stable Cardiovascular status: stable Postop Assessment: no headache, no backache and epidural receding Anesthetic complications: no   No notable events documented.  Last Vitals:  Vitals:   06/30/22 2356 07/01/22 0401  BP: 124/77 125/88  Pulse: 73 77  Resp: 18 18  Temp: 36.8 C 36.5 C  SpO2:      Last Pain:  Vitals:   07/01/22 0840  TempSrc:   PainSc: 0-No pain   Pain Goal: Patients Stated Pain Goal: 2 (06/30/22 1615)              Epidural/Spinal Function Cutaneous sensation: Normal sensation (07/01/22 0840), Patient able to flex knees: Yes (07/01/22 0840), Patient able to lift hips off bed: Yes (07/01/22 0840), Back pain beyond tenderness at insertion site: No (07/01/22 0840), Progressively worsening motor and/or sensory loss: No (07/01/22 0840), Bowel and/or bladder incontinence post epidural: No (07/01/22 0840)  [facility] Records

## 2022-07-03 LAB — SURGICAL PATHOLOGY

## 2022-07-03 NOTE — Discharge Summary (Signed)
Postpartum Discharge Summary  Date of Service updated***     Patient Name: Tammie Hernandez DOB: 1986/02/12 MRN: [id number]  Date of admission: 06/29/2022 Delivery date:06/30/2022  Delivering provider: Shelsy Seng  Date of discharge: 07/03/2022  Admitting diagnosis: Chronic hypertension in pregnancy [O10.919] Postpartum care following vaginal delivery [Z39.2] Intrauterine pregnancy: 100w1d    Secondary diagnosis:  Principal Problem:   Chronic hypertension in pregnancy Active Problems:   Postpartum care following vaginal delivery  Additional problems: ***    Discharge diagnosis: {DX.:23714}                                              Post partum procedures:{Postpartum procedures:23558} Augmentation: {{ONGEXBMWUXLK:44010}Complications: {OB Labor/Delivery Complications:20784}  Hospital course: {Courses:23701}  Magnesium Sulfate received: {Mag received:30440022} BMZ received: {BMZ received:30440023} Rhophylac:{Rhophylac received:30440032} MMR:{MMR:30440033} T-DaP:{Tdap:23962} Flu: {{UVO:53664}Transfusion:{Transfusion received:30440034}  Physical exam  Vitals:   06/30/22 2356 07/01/22 0401 07/01/22 1027 07/01/22 1614  BP: 124/77 125/88 124/79 120/88  Pulse: 73 77 75 86  Resp: _0 Temp: 98.2 F (36.8 C) 97.7 F (36.5 C)  98.2 F (36.8 C)  TempSrc: Oral Oral  Oral  SpO2:    100%  Weight:      Height:       General: {Exam; general:21111117} Lochia: {Desc; appropriate/inappropriate:30686::"appropriate"} Uterine Fundus: {Desc; firm/soft:30687} Incision: {Exam; incision:21111123} DVT Evaluation: {Exam; dvt:2111122} Labs: Lab Results  Component Value Date   WBC 8.5 07/01/2022   HGB 10.2 (L) 07/01/2022   HCT 30.1 (L) 07/01/2022   MCV 95.6 07/01/2022   PLT 208 07/01/2022      Latest Ref Rng & Units 06/29/2022    7:45 AM  CMP  Glucose 70 - 99 mg/dL 81   BUN 6 - 20 mg/dL 6   Creatinine 0.44 - 1.00 mg/dL 0.65   Sodium 135 - 145 mmol/L  134   Potassium 3.5 - 5.1 mmol/L 3.2   Chloride 98 - 111 mmol/L 102   CO2 22 - 32 mmol/L 20   Calcium 8.9 - 10.3 mg/dL 9.3   Total Protein 6.5 - 8.1 g/dL 7.6   Total Bilirubin 0.3 - 1.2 mg/dL 1.1   Alkaline Phos 38 - 126 U/L 151   AST 15 - 41 U/L 42   ALT 0 - 44 U/L 23    Edinburgh Score:    06/30/2022    8:00 AM  Edinburgh Postnatal Depression Scale Screening Tool  I have been able to laugh and see the funny side of things. 0  I have looked forward with enjoyment to things. 0  I have blamed myself unnecessarily when things went wrong. 0  I have been anxious or worried for no good reason. 1  I have felt scared or panicky for no good reason. 0  Things have been getting on top of me. 0  I have been so unhappy that I have had difficulty sleeping. 0  I have felt sad or miserable. 0  I have been so unhappy that I have been crying. 0  The thought of harming myself has occurred to me. 0  Edinburgh Postnatal Depression Scale Total 1      After visit meds:  Allergies as of 07/01/2022       Reactions   Flagyl [metronidazole]         Medication List     TAKE  these medications    ibuprofen 600 MG tablet Commonly known as: ADVIL Take 1 tablet (600 mg total) by mouth every 6 (six) hours as needed.   NIFEdipine 30 MG 24 hr tablet Commonly known as: PROCARDIA-XL/NIFEDICAL-XL Take 1 tablet (30 mg total) by mouth daily.         Discharge home in stable condition Infant Feeding: {Baby feeding:23562} Infant Disposition:{CHL IP OB HOME WITH GSUPJS:31594} Discharge instruction: per After Visit Summary and Postpartum booklet. Activity: Advance as tolerated. Pelvic rest for 6 weeks.  Diet: {OB diet:21111121} Anticipated Birth Control: {Birth Control:23956} Postpartum Appointment:{Outpatient follow up:23559} Additional Postpartum F/U: {PP Procedure:23957} Future Appointments:No future appointments. Follow up Visit:  Follow-up Information     Servando Salina, MD  Follow up in 6 week(s).   Specialty: Obstetrics and Gynecology Contact information: [address] [place] [address] [phone]                     07/03/2022 Marvene Staff, MD

## 2022-07-04 ENCOUNTER — Inpatient Hospital Stay (HOSPITAL_COMMUNITY)
Admission: AD | Admit: 2022-07-04 | Discharge: 2022-07-05 | Disposition: A | Discharge status: Home / Self Care | Payer: 59 | Attending: Obstetrics and Gynecology | Admitting: Obstetrics and Gynecology

## 2022-07-04 DIAGNOSIS — R519 Headache, unspecified: Secondary | ICD-10-CM | POA: Insufficient documentation

## 2022-07-04 DIAGNOSIS — I10 Essential (primary) hypertension: Secondary | ICD-10-CM | POA: Insufficient documentation

## 2022-07-04 DIAGNOSIS — O1003 Pre-existing essential hypertension complicating the puerperium: Secondary | ICD-10-CM

## 2022-07-05 ENCOUNTER — Encounter (HOSPITAL_COMMUNITY): Payer: Self-pay | Admitting: Obstetrics and Gynecology

## 2022-07-05 DIAGNOSIS — R519 Headache, unspecified: Secondary | ICD-10-CM | POA: Diagnosis present

## 2022-07-05 DIAGNOSIS — I10 Essential (primary) hypertension: Secondary | ICD-10-CM | POA: Diagnosis not present

## 2022-07-05 LAB — URINALYSIS, ROUTINE W REFLEX MICROSCOPIC
Bacteria, UA: NONE SEEN
Bilirubin Urine: NEGATIVE
Glucose, UA: NEGATIVE mg/dL
Ketones, ur: NEGATIVE mg/dL
Nitrite: NEGATIVE
Protein, ur: NEGATIVE mg/dL
Specific Gravity, Urine: 1.011 (ref 1.005–1.030)
pH: 7 (ref 5.0–8.0)

## 2022-07-05 LAB — CBC
HCT: 31.8 % — ABNORMAL LOW (ref 36.0–46.0)
Hemoglobin: 11.2 g/dL — ABNORMAL LOW (ref 12.0–15.0)
MCH: 33.1 pg (ref 26.0–34.0)
MCHC: 35.2 g/dL (ref 30.0–36.0)
MCV: 94.1 fL (ref 80.0–100.0)
Platelets: 214 10*3/uL (ref 150–400)
RBC: 3.38 MIL/uL — ABNORMAL LOW (ref 3.87–5.11)
RDW: 12.5 % (ref 11.5–15.5)
WBC: 8.5 10*3/uL (ref 4.0–10.5)
nRBC: 0 % (ref 0.0–0.2)

## 2022-07-05 LAB — COMPREHENSIVE METABOLIC PANEL
ALT: 59 U/L — ABNORMAL HIGH (ref 0–44)
AST: 53 U/L — ABNORMAL HIGH (ref 15–41)
Albumin: 3 g/dL — ABNORMAL LOW (ref 3.5–5.0)
Alkaline Phosphatase: 94 U/L (ref 38–126)
Anion gap: 9 (ref 5–15)
BUN: 11 mg/dL (ref 6–20)
CO2: 23 mmol/L (ref 22–32)
Calcium: 8.9 mg/dL (ref 8.9–10.3)
Chloride: 107 mmol/L (ref 98–111)
Creatinine, Ser: 0.65 mg/dL (ref 0.44–1.00)
GFR, Estimated: >60 mL/min (ref >60)
Glucose, Bld: 93 mg/dL (ref 70–99)
Potassium: 3.6 mmol/L (ref 3.5–5.1)
Sodium: 139 mmol/L (ref 135–145)
Total Bilirubin: 0.5 mg/dL (ref 0.3–1.2)
Total Protein: 6.2 g/dL — ABNORMAL LOW (ref 6.5–8.1)

## 2022-07-05 LAB — PROTEIN / CREATININE RATIO, URINE
Creatinine, Urine: 68 mg/dL
Protein Creatinine Ratio: 0.16 mg/mg{Cre} — ABNORMAL HIGH (ref 0.00–0.15)
Total Protein, Urine: 11 mg/dL

## 2022-07-05 MED ORDER — NIFEDIPINE ER OSMOTIC RELEASE 30 MG PO TB24: 60.0000 mg | ORAL_TABLET | Freq: Every day | ORAL | 11 refills | Status: DC

## 2022-07-05 NOTE — MAU Provider Note (Signed)
History     Chief Complaint  Patient presents with   Headache   Hypertension  36 yo G3P2012 MBF s/p SVD 11/17 with known chronic HTN on procardia presented with c/o elevated BP at home and h/a all day. Denies leg swelling, visual changes or epigastric pain . Took procardia yesterday am. Motrin at 9 pm   OB History     Gravida  3   Para  3   Term  2   Preterm      AB  0   Living  2      SAB  0   IAB      Ectopic      Multiple  0   Live Births  2           Past Medical History:  Diagnosis Date   Allergy    Hypertension     History reviewed. No pertinent surgical history.  Family History  Problem Relation Age of Onset   Stroke Mother        TIA   Hypertension Mother    Diabetes Father    Hypertension Father    Hypertension Paternal Uncle    Diabetes Maternal Grandmother    Hypertension Maternal Grandmother    Hypertension Maternal Grandfather    Hypertension Paternal Grandmother    Hypertension Paternal Grandfather     Social History   Tobacco Use   Smoking status: Never   Smokeless tobacco: Never  Substance Use Topics   Alcohol use: Yes    Comment: occasional   Drug use: No    Allergies:  Allergies  Allergen Reactions   Flagyl [Metronidazole]     Medications Prior to Admission  Medication Sig Dispense Refill Last Dose   ibuprofen (ADVIL) 600 MG tablet Take 1 tablet (600 mg total) by mouth every 6 (six) hours as needed. 30 tablet 11 07/05/2022   NIFEdipine (PROCARDIA-XL/NIFEDICAL-XL) 30 MG 24 hr tablet Take 1 tablet (30 mg total) by mouth daily. 30 tablet 11 07/05/2022     Physical Exam   Blood pressure (!) 144/86, pulse 70, temperature 98 F (36.7 C), temperature source Oral, resp. rate 18, height 5\' 5"  (1.651 m), weight 78.4 kg, SpO2 100 %, not currently breastfeeding.  General appearance: alert, cooperative, and no distress Lungs: clear to auscultation bilaterally Heart: regular rate and rhythm, S1, S2 normal, no murmur,  click, rub or gallop Extremities: no edema, redness or tenderness in the calves or thighs ED Course  Chronic HTn r/o superimposed preeclampsia P) PIH labs.  MDM Results for orders placed or performed during the hospital encounter of 07/04/22 (from the past 24 hour(s))  Urinalysis, Routine w reflex microscopic     Status: Abnormal   Collection Time: 07/05/22 12:51 AM  Result Value Ref Range   Color, Urine STRAW (A) YELLOW   APPearance CLEAR CLEAR   Specific Gravity, Urine 1.011 1.005 - 1.030   pH 7.0 5.0 - 8.0   Glucose, UA NEGATIVE NEGATIVE mg/dL   Hgb urine dipstick MODERATE (A) NEGATIVE   Bilirubin Urine NEGATIVE NEGATIVE   Ketones, ur NEGATIVE NEGATIVE mg/dL   Protein, ur NEGATIVE NEGATIVE mg/dL   Nitrite NEGATIVE NEGATIVE   Leukocytes,Ua TRACE (A) NEGATIVE   RBC / HPF 0-5 0 - 5 RBC/hpf   WBC, UA 0-5 0 - 5 WBC/hpf   Bacteria, UA NONE SEEN NONE SEEN   Squamous Epithelial / LPF 0-5 0 - 5   Mucus PRESENT   Protein / creatinine ratio, urine  Status: Abnormal   Collection Time: 07/05/22 12:51 AM  Result Value Ref Range   Creatinine, Urine 68 mg/dL   Total Protein, Urine 11 mg/dL   Protein Creatinine Ratio 0.16 (H) 0.00 - 0.15 mg/mg[Cre]  CBC     Status: Abnormal   Collection Time: 07/05/22  1:24 AM  Result Value Ref Range   WBC 8.5 4.0 - 10.5 K/uL   RBC 3.38 (L) 3.87 - 5.11 MIL/uL   Hemoglobin 11.2 (L) 12.0 - 15.0 g/dL   HCT 48.1 (L) 85.6 - 31.4 %   MCV 94.1 80.0 - 100.0 fL   MCH 33.1 26.0 - 34.0 pg   MCHC 35.2 30.0 - 36.0 g/dL   RDW 97.0 26.3 - 78.5 %   Platelets 214 150 - 400 K/uL   nRBC 0.0 0.0 - 0.2 %  Comprehensive metabolic panel     Status: Abnormal   Collection Time: 07/05/22  1:24 AM  Result Value Ref Range   Sodium 139 135 - 145 mmol/L   Potassium 3.6 3.5 - 5.1 mmol/L   Chloride 107 98 - 111 mmol/L   CO2 23 22 - 32 mmol/L   Glucose, Bld 93 70 - 99 mg/dL   BUN 11 6 - 20 mg/dL   Creatinine, Ser 8.85 0.44 - 1.00 mg/dL   Calcium 8.9 8.9 - 02.7 mg/dL    Total Protein 6.2 (L) 6.5 - 8.1 g/dL   Albumin 3.0 (L) 3.5 - 5.0 g/dL   AST 53 (H) 15 - 41 U/L   ALT 59 (H) 0 - 44 U/L   Alkaline Phosphatase 94 38 - 126 U/L   Total Bilirubin 0.5 0.3 - 1.2 mg/dL   GFR, Estimated >74 >12 mL/min   Anion gap 9 5 - 15   Mild elev LFT Plt ct nl. PCR 0.16 Currently h/a better per pt 4/10 Disc if felt need for admission and magnesium sulfate. Pt did not think so and I agree( pt does not severe features to warrant admit). May use tylenol and motrin for h/a. Oral hydration. Will increase her procardia to 60mg  /d. D/c home  Pt will f/u later on today via phone , MD 2:15 AM 07/05/2022

## 2022-07-05 NOTE — MAU Note (Addendum)
.  Tammie Hernandez is a 36 y.o. at Postpartum here in MAU reporting: reporting she has been feeling a little "woozy" and she has had a HA (6/10). Patient reports BP reading of 167/107 at home. She has taken her Procardia as prescribed for her CHTN. Denies visual changes, RUQ/epigastric pain or abnormal swelling. LMP: N/A Onset of complaint: today Pain score: 6/10 Vitals:   07/05/22 0037  BP: (!) 141/95  Pulse: 74  Resp: 18  Temp: 98 F (36.7 C)  SpO2: 100%     FHT:N/A Lab orders placed from triage:  UA

## 2022-07-10 ENCOUNTER — Telehealth (HOSPITAL_COMMUNITY): Payer: Self-pay | Admitting: *Deleted

## 2022-07-10 NOTE — Telephone Encounter (Signed)
Hospital Discharge Follow-Up Call:  Patient reports that she is well and has no concerns about her healing process.  EPDS today was 4 and she endorses this accurately reflects that she is doing well emotionally.  Patient says that baby is well.  She notes a white coating on baby's tongue and wonders if this is "milk tongue" or thrush.  She reports that baby is eating well and does not seem at all disturbed by it.  They have a routine follow-up appointment at the pediatrician on Fri, Dec 1 and she plans to ask about it then.  She reports that baby is sleeping in a beside bassinet.  Reviewed ABCs of Safe Sleep.

## 2022-08-18 LAB — HM PAP SMEAR: HPV, high-risk: NEGATIVE

## 2023-02-01 ENCOUNTER — Ambulatory Visit: Payer: 59 | Admitting: Family Medicine

## 2023-02-01 ENCOUNTER — Encounter: Payer: Self-pay | Admitting: Family Medicine

## 2023-02-01 VITALS — BP 128/86 | HR 85 | Temp 99.1°F | Ht 66.0 in | Wt 182.4 lb

## 2023-02-01 DIAGNOSIS — R635 Abnormal weight gain: Secondary | ICD-10-CM | POA: Diagnosis not present

## 2023-02-01 DIAGNOSIS — I10 Essential (primary) hypertension: Secondary | ICD-10-CM

## 2023-02-01 DIAGNOSIS — Z7689 Persons encountering health services in other specified circumstances: Secondary | ICD-10-CM

## 2023-02-01 NOTE — Progress Notes (Signed)
Established Patient Office Visit   Subjective  Patient ID: Tammie Hernandez, female    DOB: Jul 14, 1986  Age: 37 y.o. MRN: [id number]  Chief Complaint  Patient presents with   Establish Care    Pt is a 37 yo female seen for est care and f/u on chronic conditions.  Pt followed by OB/Gyn.  Inquires about age-appropriate health screenings  HTN:  chronic HTN in pregnancy.  Delivered a baby girl 7 months ago.  On procardia XL 60 mg.  Previously on Procardia 30 mg.  Inquires about decreasing dose.  Planning to start sizing and making diet changes consistently.  Eating out less.  Patient notes family history of HTN in mom, dad, grandparents.   Weight gain: She weighed a little more than she does now when she gave birth.  Typically around 140s. Wants to work on losing weight.  Allergies: Flagyl-hives  Social history: Patient is married.  She has 2 daughters.  She is currently working as an Artist at [employer].  Patient endorses rare social EtOH use.  Patient denies tobacco and drug use.  Health maintenance: LMP 01/03/2023 Eye doctor-[facility] Dentist-Dr. Jomarie Longs Last Pap prior to pregnancy 10/2021?  Family medical history: Mom-, HTN Dad-DM 2, HTN, kidney disease Sister-HLD MGM-DM 2, HTN    Patient Active Problem List   Diagnosis Date Noted   Postpartum care following vaginal delivery 06/30/2022   Chronic hypertension in pregnancy 06/29/2022   Allergic rhinitis 10/16/2016   History reviewed. No pertinent surgical history. Social History   Tobacco Use   Smoking status: Never   Smokeless tobacco: Never  Substance Use Topics   Alcohol use: Yes    Comment: occasional   Drug use: No   Family History  Problem Relation Age of Onset   Stroke Mother        TIA   Hypertension Mother    Diabetes Father    Hypertension Father    Hypertension Paternal Uncle    Diabetes Maternal Grandmother    Hypertension Maternal Grandmother     Hypertension Maternal Grandfather    Hypertension Paternal Grandmother    Hypertension Paternal Grandfather    Allergies  Allergen Reactions   Flagyl [Metronidazole]       ROS Negative unless stated above    Objective:     BP 128/86 (BP Location: Right Arm, Patient Position: Sitting, Cuff Size: Normal)   Pulse 85   Temp 99.1 F (37.3 C) (Oral)   Ht 5\' 6"  (1.676 m)   Wt 182 lb 6.4 oz (82.7 kg)   LMP 01/04/2023 (Exact Date)   SpO2 98%   BMI 29.44 kg/m    Physical Exam Constitutional:      General: She is not in acute distress.    Appearance: Normal appearance.  HENT:     Head: Normocephalic and atraumatic.     Nose: Nose normal.     Mouth/Throat:     Mouth: Mucous membranes are moist.  Cardiovascular:     Rate and Rhythm: Normal rate and regular rhythm.     Heart sounds: Normal heart sounds. No murmur heard.    No gallop.  Pulmonary:     Effort: Pulmonary effort is normal. No respiratory distress.     Breath sounds: Normal breath sounds. No wheezing, rhonchi or rales.  Skin:    General: Skin is warm and dry.  Neurological:     Mental Status: She is alert and oriented to person, place, and  time.      No results found for any visits on 02/01/23.    Assessment & Plan:  Essential hypertension -Controlled -Continue Procardia XL 60 mg daily -Continue lifestyle modifications  Encounter to establish care -We reviewed the PMH, PSH, FH, SH, Meds and Allergies. -We provided refills for any medications we will prescribe as needed. -We addressed current concerns per orders and patient instructions. -We have asked for records for pertinent exams, studies, vaccines and notes from previous providers. -We have advised patient to follow up per instructions below.  Weight gain -Body mass index is 29.44 kg/m. -Patient would like to get weight back down to 140s/150s -Discussed lifestyle modifications diet changes and exercise -Obtain labs at upcoming visit for  CPE   Return if symptoms worsen or fail to improve, for physical.   Deeann Saint, MD

## 2023-07-05 ENCOUNTER — Encounter: Payer: Self-pay | Admitting: Family Medicine

## 2023-07-05 ENCOUNTER — Ambulatory Visit: Payer: 59 | Admitting: Family Medicine

## 2023-07-05 VITALS — BP 126/80 | HR 76 | Temp 98.1°F | Ht 66.0 in | Wt 173.8 lb

## 2023-07-05 DIAGNOSIS — E781 Pure hyperglyceridemia: Secondary | ICD-10-CM | POA: Diagnosis not present

## 2023-07-05 DIAGNOSIS — I10 Essential (primary) hypertension: Secondary | ICD-10-CM | POA: Insufficient documentation

## 2023-07-05 DIAGNOSIS — Z0000 Encounter for general adult medical examination without abnormal findings: Secondary | ICD-10-CM | POA: Diagnosis not present

## 2023-07-05 DIAGNOSIS — R4589 Other symptoms and signs involving emotional state: Secondary | ICD-10-CM | POA: Insufficient documentation

## 2023-07-05 DIAGNOSIS — Z124 Encounter for screening for malignant neoplasm of cervix: Secondary | ICD-10-CM

## 2023-07-05 DIAGNOSIS — R202 Paresthesia of skin: Secondary | ICD-10-CM

## 2023-07-05 DIAGNOSIS — Z23 Encounter for immunization: Secondary | ICD-10-CM

## 2023-07-05 DIAGNOSIS — N946 Dysmenorrhea, unspecified: Secondary | ICD-10-CM | POA: Insufficient documentation

## 2023-07-05 LAB — VITAMIN B12: Vitamin B-12: 443 pg/mL (ref 211–911)

## 2023-07-05 LAB — CBC WITH DIFFERENTIAL/PLATELET
Basophils Absolute: 0 10*3/uL (ref 0.0–0.1)
Basophils Relative: 0.6 % (ref 0.0–3.0)
Eosinophils Absolute: 0.1 10*3/uL (ref 0.0–0.7)
Eosinophils Relative: 0.8 % (ref 0.0–5.0)
HCT: 39.6 % (ref 36.0–46.0)
Hemoglobin: 13.2 g/dL (ref 12.0–15.0)
Lymphocytes Relative: 35.7 % (ref 12.0–46.0)
Lymphs Abs: 2.3 10*3/uL (ref 0.7–4.0)
MCHC: 33.3 g/dL (ref 30.0–36.0)
MCV: 92.9 fL (ref 78.0–100.0)
Monocytes Absolute: 0.3 10*3/uL (ref 0.1–1.0)
Monocytes Relative: 5.4 % (ref 3.0–12.0)
Neutro Abs: 3.7 10*3/uL (ref 1.4–7.7)
Neutrophils Relative %: 57.5 % (ref 43.0–77.0)
Platelets: 379 10*3/uL (ref 150.0–400.0)
RBC: 4.26 Mil/uL (ref 3.87–5.11)
RDW: 12.6 % (ref 11.5–15.5)
WBC: 6.4 10*3/uL (ref 4.0–10.5)

## 2023-07-05 LAB — COMPREHENSIVE METABOLIC PANEL
ALT: 10 U/L (ref 0–35)
AST: 16 U/L (ref 0–37)
Albumin: 4.5 g/dL (ref 3.5–5.2)
Alkaline Phosphatase: 60 U/L (ref 39–117)
BUN: 10 mg/dL (ref 6–23)
CO2: 24 meq/L (ref 19–32)
Calcium: 9.5 mg/dL (ref 8.4–10.5)
Chloride: 103 meq/L (ref 96–112)
Creatinine, Ser: 0.79 mg/dL (ref 0.40–1.20)
GFR: 95.8 mL/min (ref >60.00)
Glucose, Bld: 85 mg/dL (ref 70–99)
Potassium: 3.9 meq/L (ref 3.5–5.1)
Sodium: 138 meq/L (ref 135–145)
Total Bilirubin: 0.7 mg/dL (ref 0.2–1.2)
Total Protein: 7.5 g/dL (ref 6.0–8.3)

## 2023-07-05 LAB — LIPID PANEL
Cholesterol: 165 mg/dL (ref 0–200)
HDL: 42.7 mg/dL (ref >39.00)
LDL Cholesterol: 110 mg/dL — ABNORMAL HIGH (ref 0–99)
NonHDL: 122.57
Total CHOL/HDL Ratio: 4
Triglycerides: 64 mg/dL (ref 0.0–149.0)
VLDL: 12.8 mg/dL (ref 0.0–40.0)

## 2023-07-05 LAB — HEMOGLOBIN A1C: Hgb A1c MFr Bld: 5.8 % (ref 4.6–6.5)

## 2023-07-05 LAB — TSH: TSH: 0.73 u[IU]/mL (ref 0.35–5.50)

## 2023-07-05 MED ORDER — NIFEDIPINE ER OSMOTIC RELEASE 60 MG PO TB24: 60.0000 mg | ORAL_TABLET | Freq: Every day | ORAL | 3 refills | Status: DC

## 2023-07-05 NOTE — Progress Notes (Signed)
Established Patient Office Visit   Subjective  Patient ID: Tammie Hernandez, female    DOB: 1986/08/12  Age: 37 y.o. MRN: [id number]  Chief Complaint  Patient presents with   Annual Exam    Patient has been having numbness and tingling in hands and feet, which started a year ago     Patient is a 37 year old female seen for CPE.  Patient states she has been doing well overall staying busy with a 42 year old and a 77-year-old.  Plans to restart exercising and eating better.  Taking Procardia 60 mg daily for BP.  Has not checked BP recently as needed batteries for blood pressure cuff.  Patient endorses numbness, tingling in hands and feet intermittently.  Notices at night.  Sensation in hand often present in the morning.  Patient does sleep on her arm.  Followed by OB/GYN, Dr. Cherly Hensen.    Patient Active Problem List   Diagnosis Date Noted   Anxiety about health 07/05/2023   Dysmenorrhea 07/05/2023   High blood pressure 07/05/2023   Chronic hypertension in pregnancy 06/29/2022   Postpartum examination following vaginal delivery 10/25/2012   Supervision of normal first pregnancy 05/06/2012   Rhinitis 11/27/2011   Personal history of other specified conditions 11/27/2011   Past Medical History:  Diagnosis Date   Allergy    Hypertension    History reviewed. No pertinent surgical history. Social History   Tobacco Use   Smoking status: Never   Smokeless tobacco: Never  Substance Use Topics   Alcohol use: Yes    Comment: occasional   Drug use: No   Family History  Problem Relation Age of Onset   Stroke Mother        TIA   Hypertension Mother    Diabetes Father    Hypertension Father    Hypertension Paternal Uncle    Diabetes Maternal Grandmother    Hypertension Maternal Grandmother    Hypertension Maternal Grandfather    Hypertension Paternal Grandmother    Hypertension Paternal Grandfather    Allergies  Allergen Reactions   Flagyl [Metronidazole]        ROS Negative unless stated above    Objective:     BP (!) 140/80 (BP Location: Right Arm, Patient Position: Sitting, Cuff Size: Normal)   Pulse 76   Temp 98.1 F (36.7 C) (Oral)   Ht 5\' 6"  (1.676 m)   Wt 173 lb 12.8 oz (78.8 kg)   LMP 06/18/2023 (Exact Date)   SpO2 94%   BMI 28.05 kg/m  BP Readings from Last 3 Encounters:  07/05/23 (!) 140/80  02/01/23 128/86  07/05/22 (!) 153/88   Wt Readings from Last 3 Encounters:  07/05/23 173 lb 12.8 oz (78.8 kg)  02/01/23 182 lb 6.4 oz (82.7 kg)  07/05/22 172 lb 12.8 oz (78.4 kg)      Physical Exam Constitutional:      Appearance: Normal appearance.  HENT:     Head: Normocephalic and atraumatic.     Right Ear: Tympanic membrane, ear canal and external ear normal.     Left Ear: Tympanic membrane, ear canal and external ear normal.     Nose: Nose normal.     Mouth/Throat:     Mouth: Mucous membranes are moist.     Pharynx: No oropharyngeal exudate or posterior oropharyngeal erythema.  Eyes:     General: No scleral icterus.    Extraocular Movements: Extraocular movements intact.     Conjunctiva/sclera: Conjunctivae normal.     Pupils: Pupils  are equal, round, and reactive to light.  Neck:     Thyroid: No thyromegaly.  Cardiovascular:     Rate and Rhythm: Normal rate and regular rhythm.     Pulses: Normal pulses.     Heart sounds: Normal heart sounds. No murmur heard.    No friction rub.  Pulmonary:     Effort: Pulmonary effort is normal.     Breath sounds: Normal breath sounds. No wheezing, rhonchi or rales.  Abdominal:     General: Bowel sounds are normal.     Palpations: Abdomen is soft.     Tenderness: There is no abdominal tenderness.  Musculoskeletal:        General: No deformity. Normal range of motion.  Lymphadenopathy:     Cervical: No cervical adenopathy.  Skin:    General: Skin is warm and dry.     Findings: No lesion.  Neurological:     General: No focal deficit present.     Mental Status: She is  alert and oriented to person, place, and time.     Comments: Negative Tinel and Phalen's on bilateral UEs.  No paresthesias with tapping on epicondyles.  Psychiatric:        Mood and Affect: Mood normal.        Thought Content: Thought content normal.       07/05/2023    9:44 AM 02/01/2023   10:19 AM  Depression screen PHQ 2/9  Decreased Interest 0 0  Down, Depressed, Hopeless 0 0  PHQ - 2 Score 0 0  Altered sleeping 1 0  Tired, decreased energy 0 1  Change in appetite 1 0  Feeling bad or failure about yourself  0 0  Trouble concentrating 1 0  Moving slowly or fidgety/restless 0 1  Suicidal thoughts 0 0  PHQ-9 Score 3 2  Difficult doing work/chores Not difficult at all Somewhat difficult      07/05/2023    9:45 AM 02/01/2023   10:19 AM  GAD 7 : Generalized Anxiety Score  Nervous, Anxious, on Edge 1 1  Control/stop worrying 0 0  Worry too much - different things 1 1  Trouble relaxing 1 1  Restless 1 1  Easily annoyed or irritable 1 1  Afraid - awful might happen 1 0  Total GAD 7 Score 6 5  Anxiety Difficulty Somewhat difficult Somewhat difficult    No results found for any visits on 07/05/23.    Assessment & Plan:  Well adult exam  Need for influenza vaccination -     Flu vaccine trivalent PF, 6mos and older(Flulaval,Afluria,Fluarix,Fluzone)  Essential hypertension -     Comprehensive metabolic panel; Future -     TSH; Future -     NIFEdipine ER Osmotic Release; Take 1 tablet (60 mg total) by mouth daily.  Dispense: 90 tablet; Refill: 3  Paresthesias -     CBC with Differential/Platelet; Future -     Comprehensive metabolic panel; Future -     Hemoglobin A1c; Future -     Vitamin B12; Future  Hypertriglyceridemia -     Comprehensive metabolic panel; Future -     Lipid panel; Future  Cervical cancer screening  Age-appropriate health screenings discussed.  Will obtain labs this visit.  Pap up-to-date.  Done 10/2021 with Dr. Cherly Hensen, OB/GYN.  Mammogram and  colonoscopy not yet indicated.  Pt is reviewed.  Influenza vaccine given this visit.  Tdap up-to-date was given prenatally (06/15/2022).  Continue Procardia XL 60 mg daily.  Lifestyle modifications encouraged.  Patient also encouraged to start monitoring BP at home.  Notify clinic for readings consistently greater than 140/90.  Obtain labs to evaluate paresthesias.  Lipid panel for history of hypertriglyceridemia.  Triglycerides 256 on 09/16/2020.  Next CPE in 1 year.  BP recheck in 3 months.  Return in about 3 months (around 10/05/2023) for blood pressure.   Deeann Saint, MD

## 2023-08-17 ENCOUNTER — Telehealth: Payer: Self-pay | Admitting: Family Medicine

## 2023-08-17 NOTE — Telephone Encounter (Signed)
 Copied from CRM [id number]. Topic: Clinical - Prescription Issue >> Aug 17, 2023 10:35 AM Merlynn LABOR wrote: Reason for CRM: [facility] prescription NIFEdipine  (PROCARDIA  XL/NIFEDICAL XL) 60 MG 24 hr tablet  to 90 day supply mail order with [facility]. Please contact [facility] for additional assistance if needed at [phone].

## 2023-08-20 NOTE — Telephone Encounter (Signed)
Ok for rx to be sent to mail order pharmacy.

## 2024-02-27 ENCOUNTER — Encounter: Payer: Self-pay | Admitting: Family Medicine

## 2024-02-27 ENCOUNTER — Ambulatory Visit: Admitting: Family Medicine

## 2024-02-27 VITALS — BP 118/72 | HR 73 | Temp 99.3°F | Ht 66.0 in | Wt 152.0 lb

## 2024-02-27 DIAGNOSIS — R5383 Other fatigue: Secondary | ICD-10-CM | POA: Diagnosis not present

## 2024-02-27 DIAGNOSIS — R634 Abnormal weight loss: Secondary | ICD-10-CM

## 2024-02-27 DIAGNOSIS — R102 Pelvic and perineal pain: Secondary | ICD-10-CM | POA: Diagnosis not present

## 2024-02-27 LAB — POCT URINE PREGNANCY: Preg Test, Ur: NEGATIVE

## 2024-02-27 LAB — POCT URINALYSIS DIPSTICK
Bilirubin, UA: NEGATIVE
Blood, UA: NEGATIVE
Glucose, UA: NEGATIVE
Ketones, UA: NEGATIVE
Leukocytes, UA: NEGATIVE
Nitrite, UA: NEGATIVE
Protein, UA: POSITIVE — AB
Spec Grav, UA: 1.01 (ref 1.010–1.025)
Urobilinogen, UA: 0.2 U/dL
pH, UA: 8 (ref 5.0–8.0)

## 2024-02-27 NOTE — Progress Notes (Signed)
 Established Patient Office Visit   Subjective  Patient ID: Tammie Hernandez, female    DOB: 03-20-1986  Age: 38 y.o. MRN: [id number]  Chief Complaint  Patient presents with   Medical Management of Chronic Issues    Weight loss     Patient is a 38 year old female seen for ongoing concern.  Patient endorses unintentional weight loss times several months.  Pt 18 months postpartum.  Weight during pregnancy 182 lbs 02/01/2023.  Weight 174 lbs in November and currently 152 lbs.  Denies changes in diet.  Does endorse increased stress x 3 months or so.  Patient's father moved to the area due having various health issues and being on dialysis.  Patient trying to do deep breathing and grounding exercises to help with stress.  Started taking magnesium to help with sleep.  Patient had recent sensation of sharp abdominal pain.  LMP was the end of June.  Next menses expected in about 5 days.  Patient was on OCPs but notes being out of medication for a week or so.  Had postpartum changes in hair such as thinning of edges.  Denies recent increase shedding, change in texture or thickness.    Patient Active Problem List   Diagnosis Date Noted   Anxiety about health 07/05/2023   Dysmenorrhea 07/05/2023   High blood pressure 07/05/2023   Chronic hypertension in pregnancy 06/29/2022   Postpartum examination following vaginal delivery 10/25/2012   Supervision of normal first pregnancy 05/06/2012   Rhinitis 11/27/2011   Personal history of other specified conditions 11/27/2011   Past Medical History:  Diagnosis Date   Allergy    Anxiety    Hypertension    History reviewed. No pertinent surgical history. Social History   Tobacco Use   Smoking status: Never   Smokeless tobacco: Never  Substance Use Topics   Alcohol use: Yes    Comment: occasional   Drug use: No   Family History  Problem Relation Age of Onset   Stroke Mother        TIA   Hypertension Mother    Asthma Mother    Diabetes  Father    Hypertension Father    Kidney disease Father    Hypertension Paternal Uncle    Diabetes Maternal Grandmother    Hypertension Maternal Grandmother    Hypertension Maternal Grandfather    Hypertension Paternal Grandmother    Hypertension Paternal Grandfather    Allergies  Allergen Reactions   Flagyl [Metronidazole]     ROS Negative unless stated above    Objective:     BP 118/72 (BP Location: Left Arm, Patient Position: Sitting, Cuff Size: Normal)   Pulse 73   Temp 99.3 F (37.4 C) (Oral)   Ht 5' 6 (1.676 m)   Wt 152 lb (68.9 kg)   LMP 02/06/2024 (Exact Date)   SpO2 98%   BMI 24.53 kg/m  BP Readings from Last 3 Encounters:  02/27/24 118/72  07/05/23 126/80  02/01/23 128/86   Wt Readings from Last 3 Encounters:  02/27/24 152 lb (68.9 kg)  07/05/23 173 lb 12.8 oz (78.8 kg)  02/01/23 182 lb 6.4 oz (82.7 kg)      Physical Exam Constitutional:      General: She is not in acute distress.    Appearance: Normal appearance.  HENT:     Head: Normocephalic and atraumatic.     Nose: Nose normal.     Mouth/Throat:     Mouth: Mucous membranes are moist.  Neck:  Thyroid: No thyromegaly.  Cardiovascular:     Rate and Rhythm: Normal rate and regular rhythm.     Heart sounds: Normal heart sounds. No murmur heard.    No gallop.  Pulmonary:     Effort: Pulmonary effort is normal. No respiratory distress.     Breath sounds: Normal breath sounds. No wheezing, rhonchi or rales.  Abdominal:     General: Bowel sounds are normal.     Palpations: Abdomen is soft.     Tenderness: There is no abdominal tenderness. There is no guarding or rebound.  Musculoskeletal:     Cervical back: No tenderness.  Lymphadenopathy:     Cervical: Cervical adenopathy present.  Skin:    General: Skin is warm and dry.  Neurological:     Mental Status: She is alert and oriented to person, place, and time.        02/27/2024    4:30 PM 07/05/2023    9:44 AM 02/01/2023   10:19 AM   Depression screen PHQ 2/9  Decreased Interest 0 0 0  Down, Depressed, Hopeless 0 0 0  PHQ - 2 Score 0 0 0  Altered sleeping 1 1 0  Tired, decreased energy 1 0 1  Change in appetite 1 1 0  Feeling bad or failure about yourself  0 0 0  Trouble concentrating 0 1 0  Moving slowly or fidgety/restless 0 0 1  Suicidal thoughts 0 0 0  PHQ-9 Score 3 3 2   Difficult doing work/chores  Not difficult at all Somewhat difficult      02/27/2024    4:30 PM 07/05/2023    9:45 AM 02/01/2023   10:19 AM  GAD 7 : Generalized Anxiety Score  Nervous, Anxious, on Edge 2 1 1   Control/stop worrying 1 0 0  Worry too much - different things 1 1 1   Trouble relaxing 2 1 1   Restless 0 1 1  Easily annoyed or irritable 1 1 1   Afraid - awful might happen 2 1 0  Total GAD 7 Score 9 6 5   Anxiety Difficulty  Somewhat difficult Somewhat difficult     Results for orders placed or performed in visit on 02/27/24  POCT urinalysis dipstick  Result Value Ref Range   Color, UA yellow    Clarity, UA cloudy    Glucose, UA Negative Negative   Bilirubin, UA neg    Ketones, UA neg    Spec Grav, UA 1.010 1.010 - 1.025   Blood, UA neg    pH, UA 8.0 5.0 - 8.0   Protein, UA Positive (A) Negative   Urobilinogen, UA 0.2 0.2 or 1.0 E.U./dL   Nitrite, UA neg    Leukocytes, UA Negative Negative   Appearance     Odor    POCT urine pregnancy  Result Value Ref Range   Preg Test, Ur Negative Negative      Assessment & Plan:   Weight loss -     TSH; Future -     T4, free; Future -     CBC with Differential/Platelet; Future -     Comprehensive metabolic panel with GFR; Future -     Hemoglobin A1c; Future -     Hepatitis C antibody; Future -     HIV Antibody (routine testing w rflx); Future -     C-reactive protein; Future  Suprapubic abdominal pain -     TSH; Future -     T4, free; Future -  CBC with Differential/Platelet; Future -     Comprehensive metabolic panel with GFR; Future -     POCT urinalysis  dipstick -     POCT urine pregnancy -     C-reactive protein; Future  Unintentional 30 pound weight loss since 02/01/2023.  PHQ-9 score 3, GAD-7 score 9 this visit.  Discussed possible causes including thyroid dysfunction, stress, DM.  Must also consider malignancy.  Will obtain labs to further evaluate.  Further recommendations if needed such as imaging based on results consider food diary.  Return in about 4 weeks (around 03/26/2024), or if symptoms worsen or fail to improve.   Clotilda JONELLE Single, MD

## 2024-02-28 ENCOUNTER — Ambulatory Visit: Payer: Self-pay | Admitting: Family Medicine

## 2024-02-28 LAB — CBC WITH DIFFERENTIAL/PLATELET
Basophils Absolute: 0.1 K/uL (ref 0.0–0.1)
Basophils Relative: 0.9 % (ref 0.0–3.0)
Eosinophils Absolute: 0.1 K/uL (ref 0.0–0.7)
Eosinophils Relative: 0.6 % (ref 0.0–5.0)
HCT: 38.5 % (ref 36.0–46.0)
Hemoglobin: 12.8 g/dL (ref 12.0–15.0)
Lymphocytes Relative: 20.4 % (ref 12.0–46.0)
Lymphs Abs: 2.1 K/uL (ref 0.7–4.0)
MCHC: 33.3 g/dL (ref 30.0–36.0)
MCV: 91.8 fl (ref 78.0–100.0)
Monocytes Absolute: 0.7 K/uL (ref 0.1–1.0)
Monocytes Relative: 6.3 % (ref 3.0–12.0)
Neutro Abs: 7.4 K/uL (ref 1.4–7.7)
Neutrophils Relative %: 71.8 % (ref 43.0–77.0)
Platelets: 328 K/uL (ref 150.0–400.0)
RBC: 4.19 Mil/uL (ref 3.87–5.11)
RDW: 13.3 % (ref 11.5–15.5)
WBC: 10.3 K/uL (ref 4.0–10.5)

## 2024-02-28 LAB — COMPREHENSIVE METABOLIC PANEL WITH GFR
ALT: 13 U/L (ref 0–35)
AST: 17 U/L (ref 0–37)
Albumin: 4.6 g/dL (ref 3.5–5.2)
Alkaline Phosphatase: 58 U/L (ref 39–117)
BUN: 11 mg/dL (ref 6–23)
CO2: 26 meq/L (ref 19–32)
Calcium: 9.5 mg/dL (ref 8.4–10.5)
Chloride: 102 meq/L (ref 96–112)
Creatinine, Ser: 0.8 mg/dL (ref 0.40–1.20)
GFR: 93.94 mL/min (ref >60.00)
Glucose, Bld: 71 mg/dL (ref 70–99)
Potassium: 3.5 meq/L (ref 3.5–5.1)
Sodium: 138 meq/L (ref 135–145)
Total Bilirubin: 1 mg/dL (ref 0.2–1.2)
Total Protein: 7.8 g/dL (ref 6.0–8.3)

## 2024-02-28 LAB — HIV ANTIBODY (ROUTINE TESTING W REFLEX): HIV 1&2 Ab, 4th Generation: NONREACTIVE

## 2024-02-28 LAB — TSH: TSH: 0.93 u[IU]/mL (ref 0.35–5.50)

## 2024-02-28 LAB — HEPATITIS C ANTIBODY: Hepatitis C Ab: NONREACTIVE

## 2024-02-28 LAB — T4, FREE: Free T4: 0.77 ng/dL (ref 0.60–1.60)

## 2024-02-28 LAB — HEMOGLOBIN A1C: Hgb A1c MFr Bld: 5.9 % (ref 4.6–6.5)

## 2024-02-28 LAB — C-REACTIVE PROTEIN: CRP: <1 mg/dL (ref 0.5–20.0)

## 2024-06-16 ENCOUNTER — Ambulatory Visit (INDEPENDENT_AMBULATORY_CARE_PROVIDER_SITE_OTHER): Admitting: Family Medicine

## 2024-06-16 ENCOUNTER — Encounter: Payer: Self-pay | Admitting: Family Medicine

## 2024-06-16 VITALS — BP 128/70 | HR 93 | Temp 99.3°F | Ht 66.0 in | Wt 151.2 lb

## 2024-06-16 DIAGNOSIS — F411 Generalized anxiety disorder: Secondary | ICD-10-CM

## 2024-06-16 MED ORDER — SERTRALINE HCL 25 MG PO TABS: 25.0000 mg | ORAL_TABLET | Freq: Every day | ORAL | 3 refills | Status: DC

## 2024-06-16 NOTE — Progress Notes (Signed)
 Established Patient Office Visit   Subjective  Patient ID: Tammie Hernandez, female    DOB: 03/20/1986  Age: 38 y.o. MRN: [id number]  Chief Complaint  Patient presents with   Acute Visit    Anxiety      Pt is a 38 yo female seen for f/u.  Pt notes feeling more irritable and on edge.  Notes symptoms related to increased anxiety.  Was previously losing wt due to anxiety.  States wt loss a few months ago was due to decreased appetite from anxiety.  Sleep is better.  Pt has a 38 yo and an 38 yo.  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  When reflecting pt notes being anxious as a child and perhaps having some postpartum depression.  Pt is in counseling regularly.  States her counselor suggested she mention anxiety to pcp.      Patient Active Problem List   Diagnosis Date Noted   Anxiety about health 07/05/2023   Dysmenorrhea 07/05/2023   High blood pressure 07/05/2023   Chronic hypertension in pregnancy 06/29/2022   Postpartum examination following vaginal delivery 10/25/2012   Supervision of normal first pregnancy 05/06/2012   Rhinitis 11/27/2011   Personal history of other specified conditions 11/27/2011   Past Medical History:  Diagnosis Date   Allergy    Anxiety    Hypertension    History reviewed. No pertinent surgical history. Social History   Tobacco Use   Smoking status: Never   Smokeless tobacco: Never  Substance Use Topics   Alcohol use: Yes    Comment: occasional   Drug use: No   Family History  Problem Relation Age of Onset   Stroke Mother        TIA   Hypertension Mother    Asthma Mother    Diabetes Father    Hypertension Father    Kidney disease Father    Hypertension Paternal Uncle    Diabetes Maternal Grandmother    Hypertension Maternal Grandmother    Hypertension Maternal Grandfather    Hypertension Paternal Grandmother    Hypertension Paternal Grandfather    Allergies  Allergen Reactions   Flagyl [Metronidazole]     ROS Negative  unless stated above    Objective:     BP 128/70 (BP Location: Left Arm, Patient Position: Sitting, Cuff Size: Normal)   Pulse 93   Temp 99.3 F (37.4 C) (Oral)   Ht 5' 6 (1.676 m)   Wt 151 lb 3.2 oz (68.6 kg)   LMP 06/05/2024 (Exact Date)   SpO2 100%   BMI 24.40 kg/m  BP Readings from Last 3 Encounters:  06/16/24 128/70  02/27/24 118/72  07/05/23 126/80   Wt Readings from Last 3 Encounters:  06/16/24 151 lb 3.2 oz (68.6 kg)  02/27/24 152 lb (68.9 kg)  07/05/23 173 lb 12.8 oz (78.8 kg)      Physical Exam Constitutional:      Appearance: Normal appearance.  HENT:     Head: Normocephalic and atraumatic.     Nose: Nose normal.     Mouth/Throat:     Mouth: Mucous membranes are moist.  Cardiovascular:     Rate and Rhythm: Normal rate.  Pulmonary:     Effort: Pulmonary effort is normal.  Skin:    General: Skin is warm and dry.  Neurological:     Mental Status: She is alert and oriented to person, place, and time. Mental status is at baseline.  Psychiatric:  Mood and Affect: Mood is anxious.        06/16/2024    2:56 PM 02/27/2024    4:30 PM 07/05/2023    9:44 AM  Depression screen PHQ 2/9  Decreased Interest 1 0 0  Down, Depressed, Hopeless 0 0 0  PHQ - 2 Score 1 0 0  Altered sleeping 0 1 1  Tired, decreased energy 1 1 0  Change in appetite  1 1  Feeling bad or failure about yourself  0 0 0  Trouble concentrating 1 0 1  Moving slowly or fidgety/restless 0 0 0  Suicidal thoughts 0 0 0  PHQ-9 Score 3 3 3   Difficult doing work/chores Not difficult at all  Not difficult at all      06/16/2024    2:56 PM 02/27/2024    4:30 PM 07/05/2023    9:45 AM 02/01/2023   10:19 AM  GAD 7 : Generalized Anxiety Score  Nervous, Anxious, on Edge 2 2 1 1   Control/stop worrying 2 1 0 0  Worry too much - different things 2 1 1 1   Trouble relaxing 2 2 1 1   Restless 1 0 1 1  Easily annoyed or irritable 2 1 1 1   Afraid - awful might happen 2 2 1  0  Total GAD 7 Score  13 9 6 5   Anxiety Difficulty Somewhat difficult  Somewhat difficult Somewhat difficult     No results found for any visits on 06/16/24.    Assessment & Plan:   GAD (generalized anxiety disorder) -     Sertraline HCl; Take 1 tablet (25 mg total) by mouth daily.  Dispense: 30 tablet; Refill: 3  PHQ 9 score 3, GAD 7 score 13 this visit.  Previously 3 and 9 respectively on 02/27/24.  Discussed r/b/a of anti-anxiety meds.  Will start zoloft 25 mg daily.  Continue counseling.  Self care encouraged.    Return in about 5 weeks (around 07/21/2024).   Clotilda JONELLE Single, MD

## 2024-06-17 ENCOUNTER — Encounter: Payer: Self-pay | Admitting: Family Medicine

## 2024-06-17 DIAGNOSIS — I10 Essential (primary) hypertension: Secondary | ICD-10-CM

## 2024-06-17 MED ORDER — NIFEDIPINE ER OSMOTIC RELEASE 60 MG PO TB24: 60.0000 mg | ORAL_TABLET | Freq: Every day | ORAL | 3 refills | Status: DC

## 2024-06-24 MED ORDER — NIFEDIPINE ER OSMOTIC RELEASE 60 MG PO TB24: 60.0000 mg | ORAL_TABLET | Freq: Every day | ORAL | 3 refills | Status: AC

## 2024-06-24 NOTE — Addendum Note (Signed)
 Addended by: BRIEN SONG A on: 06/24/2024 08:56 AM   Modules accepted: Orders

## 2024-07-14 ENCOUNTER — Other Ambulatory Visit: Payer: Self-pay | Admitting: Family Medicine

## 2024-07-14 DIAGNOSIS — F411 Generalized anxiety disorder: Secondary | ICD-10-CM

## 2024-07-28 ENCOUNTER — Encounter: Payer: Self-pay | Admitting: Family Medicine

## 2024-07-28 ENCOUNTER — Ambulatory Visit (INDEPENDENT_AMBULATORY_CARE_PROVIDER_SITE_OTHER): Admitting: Family Medicine

## 2024-07-28 VITALS — BP 114/80 | HR 85 | Temp 98.9°F | Ht 66.0 in | Wt 148.0 lb

## 2024-07-28 DIAGNOSIS — F411 Generalized anxiety disorder: Secondary | ICD-10-CM | POA: Diagnosis not present

## 2024-07-28 MED ORDER — SERTRALINE HCL 50 MG PO TABS: 50.0000 mg | ORAL_TABLET | Freq: Every day | ORAL | 0 refills | Status: AC

## 2024-07-28 NOTE — Progress Notes (Signed)
 Established Patient Office Visit   Subjective  Patient ID: Tammie Hernandez, female    DOB: 1986-06-02  Age: 38 y.o. MRN: [id number]  Chief Complaint  Patient presents with   Medical Management of Chronic Issues    Patient is a 38 year old female seen for follow-up on anxiety.  Patient taking Zoloft  25 mg daily.  Has noticed improvement in anxiety symptoms, but feels like medication has started to wear off/become less effective in the last few weeks.  Patient denies increased rest, changes in sleep, appetite, energy.  States mood is good.  Inquires about dose change.      Patient Active Problem List   Diagnosis Date Noted   Anxiety about health 07/05/2023   Dysmenorrhea 07/05/2023   High blood pressure 07/05/2023   Chronic hypertension in pregnancy 06/29/2022   Postpartum examination following vaginal delivery 10/25/2012   Supervision of normal first pregnancy 05/06/2012   Rhinitis 11/27/2011   Personal history of other specified conditions 11/27/2011   Past Medical History:  Diagnosis Date   Allergy Metranidazole   Anxiety    Hypertension    History reviewed. No pertinent surgical history. Social History[1] Family History  Problem Relation Age of Onset   Stroke Mother        TIA   Hypertension Mother    Asthma Mother    Diabetes Father    Hypertension Father    Kidney disease Father    Hypertension Paternal Uncle    Diabetes Maternal Grandmother    Hypertension Maternal Grandmother    Hypertension Maternal Grandfather    Hypertension Paternal Grandmother    Hypertension Paternal Grandfather    Allergies[2]  ROS Negative unless stated above    Objective:     BP 114/80 (BP Location: Left Arm, Patient Position: Sitting, Cuff Size: Normal)   Pulse 85   Temp 98.9 F (37.2 C) (Oral)   Ht 5' 6 (1.676 m)   Wt 148 lb (67.1 kg)   SpO2 100%   BMI 23.89 kg/m  BP Readings from Last 3 Encounters:  07/28/24 114/80  06/16/24 128/70  02/27/24 118/72    Wt Readings from Last 3 Encounters:  07/28/24 148 lb (67.1 kg)  06/16/24 151 lb 3.2 oz (68.6 kg)  02/27/24 152 lb (68.9 kg)      Physical Exam Constitutional:      General: She is not in acute distress.    Appearance: Normal appearance.  HENT:     Head: Normocephalic and atraumatic.     Nose: Nose normal.     Mouth/Throat:     Mouth: Mucous membranes are moist.  Cardiovascular:     Rate and Rhythm: Normal rate and regular rhythm.     Heart sounds: Normal heart sounds. No murmur heard.    No gallop.  Pulmonary:     Effort: Pulmonary effort is normal. No respiratory distress.     Breath sounds: Normal breath sounds. No wheezing, rhonchi or rales.  Skin:    General: Skin is warm and dry.  Neurological:     Mental Status: She is alert and oriented to person, place, and time.        07/28/2024    9:26 AM 06/16/2024    2:56 PM 02/27/2024    4:30 PM  Depression screen PHQ 2/9  Decreased Interest 0 1 0  Down, Depressed, Hopeless 0 0 0  PHQ - 2 Score 0 1 0  Altered sleeping 1 0 1  Tired, decreased energy 1 1 1  Change in appetite 0  1  Feeling bad or failure about yourself  0 0 0  Trouble concentrating 1 1 0  Moving slowly or fidgety/restless 1 0 0  Suicidal thoughts 0 0 0  PHQ-9 Score 4 3  3    Difficult doing work/chores Not difficult at all Not difficult at all      Data saved with a previous flowsheet row definition      07/28/2024    9:26 AM 06/16/2024    2:56 PM 02/27/2024    4:30 PM 07/05/2023    9:45 AM  GAD 7 : Generalized Anxiety Score  Nervous, Anxious, on Edge 1 2 2 1   Control/stop worrying 1 2 1  0  Worry too much - different things 1 2 1 1   Trouble relaxing 1 2 2 1   Restless 1 1 0 1  Easily annoyed or irritable 1 2 1 1   Afraid - awful might happen 1 2 2 1   Total GAD 7 Score 7 13 9 6   Anxiety Difficulty Not difficult at all Somewhat difficult  Somewhat difficult     No results found for any visits on 07/28/24.    Assessment & Plan:   GAD  (generalized anxiety disorder) -     Sertraline  HCl; Take 1 tablet (50 mg total) by mouth daily.  Dispense: 90 tablet; Refill: 0   Anxiety improving.  PHQ-9 score 4, GAD-7 score 7 this visit previously 3 and 13 respectively.  Subjective change in effectiveness of medication.  Increase Zoloft  from 25 to 50 mg daily.  Continue self-care and counseling.  Follow-up in 4-6 weeks, sooner if needed.  Return in about 5 weeks (around 09/01/2024).   Tammie Hernandez Single, MD    [1]  Social History Tobacco Use   Smoking status: Never   Smokeless tobacco: Never  Substance Use Topics   Alcohol use: Yes    Comment: occasional   Drug use: No  [2]  Allergies Allergen Reactions   Flagyl [Metronidazole]
